# Patient Record
Sex: Female | Born: 1960
Health system: Southern US, Community
[De-identification: ages and names within clinical notes are randomized; demographics above are authoritative.]

## PROBLEM LIST (undated history)

## (undated) DIAGNOSIS — K589 Irritable bowel syndrome without diarrhea: Secondary | ICD-10-CM

## (undated) DIAGNOSIS — K602 Anal fissure, unspecified: Secondary | ICD-10-CM

## (undated) DIAGNOSIS — K5792 Diverticulitis of intestine, part unspecified, without perforation or abscess without bleeding: Secondary | ICD-10-CM

## (undated) DIAGNOSIS — K219 Gastro-esophageal reflux disease without esophagitis: Secondary | ICD-10-CM

## (undated) DIAGNOSIS — R0602 Shortness of breath: Secondary | ICD-10-CM

## (undated) DIAGNOSIS — R519 Headache, unspecified: Secondary | ICD-10-CM

## (undated) DIAGNOSIS — R42 Dizziness and giddiness: Secondary | ICD-10-CM

## (undated) DIAGNOSIS — K5790 Diverticulosis of intestine, part unspecified, without perforation or abscess without bleeding: Secondary | ICD-10-CM

## (undated) DIAGNOSIS — E785 Hyperlipidemia, unspecified: Secondary | ICD-10-CM

## (undated) DIAGNOSIS — R51 Headache: Secondary | ICD-10-CM

## (undated) DIAGNOSIS — I10 Essential (primary) hypertension: Secondary | ICD-10-CM

## (undated) DIAGNOSIS — F419 Anxiety disorder, unspecified: Secondary | ICD-10-CM

## (undated) HISTORY — DX: Hyperlipidemia, unspecified: E78.5

## (undated) HISTORY — DX: Anal fissure, unspecified: K60.2

## (undated) HISTORY — PX: BREAST SURGERY: SHX581

## (undated) HISTORY — DX: Gastro-esophageal reflux disease without esophagitis: K21.9

## (undated) HISTORY — DX: Headache, unspecified: R51.9

## (undated) HISTORY — PX: COLONOSCOPY: SHX174

## (undated) HISTORY — PX: ABDOMINAL HYSTERECTOMY: SHX81

## (undated) HISTORY — DX: Diverticulitis of intestine, part unspecified, without perforation or abscess without bleeding: K57.92

## (undated) HISTORY — DX: Anxiety disorder, unspecified: F41.9

## (undated) HISTORY — PX: LAPAROSCOPY: SHX197

## (undated) HISTORY — DX: Irritable bowel syndrome, unspecified: K58.9

## (undated) HISTORY — DX: Essential (primary) hypertension: I10

## (undated) HISTORY — DX: Dizziness and giddiness: R42

## (undated) HISTORY — PX: NECK SURGERY: SHX720

## (undated) HISTORY — PX: RECTAL SURGERY: SHX760

## (undated) HISTORY — DX: Diverticulosis of intestine, part unspecified, without perforation or abscess without bleeding: K57.90

## (undated) HISTORY — PX: TONSILECTOMY, ADENOIDECTOMY, BILATERAL MYRINGOTOMY AND TUBES: SHX2538

## (undated) HISTORY — PX: APPENDECTOMY: SHX54

## (undated) HISTORY — PX: HIP SURGERY: SHX245

## (undated) HISTORY — DX: Headache: R51

## (undated) HISTORY — DX: Shortness of breath: R06.02

---

## 1999-12-26 ENCOUNTER — Encounter: Payer: Self-pay | Admitting: Gastroenterology

## 1999-12-26 ENCOUNTER — Encounter: Payer: Self-pay | Admitting: Internal Medicine

## 2000-03-14 ENCOUNTER — Other Ambulatory Visit: Admission: RE | Admit: 2000-03-14 | Discharge: 2000-03-14 | Payer: Self-pay | Admitting: Internal Medicine

## 2000-04-18 ENCOUNTER — Encounter: Payer: Self-pay | Admitting: Internal Medicine

## 2000-04-18 ENCOUNTER — Ambulatory Visit (HOSPITAL_COMMUNITY): Admission: RE | Admit: 2000-04-18 | Discharge: 2000-04-18 | Payer: Self-pay | Admitting: Internal Medicine

## 2001-10-26 ENCOUNTER — Encounter (INDEPENDENT_AMBULATORY_CARE_PROVIDER_SITE_OTHER): Payer: Self-pay

## 2001-10-26 ENCOUNTER — Inpatient Hospital Stay (HOSPITAL_COMMUNITY): Admission: EM | Admit: 2001-10-26 | Discharge: 2001-10-28 | Payer: Self-pay | Admitting: Emergency Medicine

## 2001-10-26 ENCOUNTER — Encounter: Payer: Self-pay | Admitting: General Surgery

## 2002-06-25 ENCOUNTER — Other Ambulatory Visit: Admission: RE | Admit: 2002-06-25 | Discharge: 2002-06-25 | Payer: Self-pay | Admitting: Internal Medicine

## 2002-07-16 ENCOUNTER — Emergency Department (HOSPITAL_COMMUNITY): Admission: EM | Admit: 2002-07-16 | Discharge: 2002-07-16 | Payer: Self-pay | Admitting: Emergency Medicine

## 2002-07-16 ENCOUNTER — Encounter: Payer: Self-pay | Admitting: Emergency Medicine

## 2003-10-03 ENCOUNTER — Encounter: Admission: RE | Admit: 2003-10-03 | Discharge: 2003-10-03 | Payer: Self-pay | Admitting: Internal Medicine

## 2004-02-05 HISTORY — PX: BREAST EXCISIONAL BIOPSY: SUR124

## 2004-02-10 ENCOUNTER — Encounter: Admission: RE | Admit: 2004-02-10 | Discharge: 2004-02-10 | Payer: Self-pay | Admitting: Internal Medicine

## 2004-02-24 ENCOUNTER — Encounter: Admission: RE | Admit: 2004-02-24 | Discharge: 2004-02-24 | Payer: Self-pay | Admitting: Internal Medicine

## 2004-05-17 ENCOUNTER — Ambulatory Visit: Payer: Self-pay | Admitting: Internal Medicine

## 2004-06-08 ENCOUNTER — Other Ambulatory Visit: Admission: RE | Admit: 2004-06-08 | Discharge: 2004-06-08 | Payer: Self-pay | Admitting: General Surgery

## 2004-06-26 ENCOUNTER — Ambulatory Visit (HOSPITAL_BASED_OUTPATIENT_CLINIC_OR_DEPARTMENT_OTHER): Admission: RE | Admit: 2004-06-26 | Discharge: 2004-06-26 | Payer: Self-pay | Admitting: General Surgery

## 2004-06-26 ENCOUNTER — Ambulatory Visit (HOSPITAL_COMMUNITY): Admission: RE | Admit: 2004-06-26 | Discharge: 2004-06-26 | Payer: Self-pay | Admitting: General Surgery

## 2004-06-26 ENCOUNTER — Encounter (INDEPENDENT_AMBULATORY_CARE_PROVIDER_SITE_OTHER): Payer: Self-pay | Admitting: Specialist

## 2004-07-04 ENCOUNTER — Ambulatory Visit: Payer: Self-pay | Admitting: Internal Medicine

## 2004-08-09 ENCOUNTER — Ambulatory Visit: Payer: Self-pay | Admitting: Family Medicine

## 2004-08-10 ENCOUNTER — Encounter: Admission: RE | Admit: 2004-08-10 | Discharge: 2004-08-10 | Payer: Self-pay | Admitting: Family Medicine

## 2004-08-13 ENCOUNTER — Encounter: Admission: RE | Admit: 2004-08-13 | Discharge: 2004-08-13 | Payer: Self-pay | Admitting: General Surgery

## 2004-10-09 ENCOUNTER — Ambulatory Visit: Payer: Self-pay | Admitting: Gastroenterology

## 2004-10-16 ENCOUNTER — Ambulatory Visit: Payer: Self-pay | Admitting: Gastroenterology

## 2004-10-16 LAB — HM COLONOSCOPY

## 2004-10-29 ENCOUNTER — Encounter: Admission: RE | Admit: 2004-10-29 | Discharge: 2004-10-29 | Payer: Self-pay | Admitting: Internal Medicine

## 2004-10-29 ENCOUNTER — Ambulatory Visit: Payer: Self-pay | Admitting: Internal Medicine

## 2005-01-02 ENCOUNTER — Ambulatory Visit: Payer: Self-pay | Admitting: Internal Medicine

## 2005-01-10 ENCOUNTER — Other Ambulatory Visit: Admission: RE | Admit: 2005-01-10 | Discharge: 2005-01-10 | Payer: Self-pay | Admitting: Internal Medicine

## 2005-01-10 ENCOUNTER — Ambulatory Visit: Payer: Self-pay | Admitting: Internal Medicine

## 2005-01-10 ENCOUNTER — Encounter: Payer: Self-pay | Admitting: Internal Medicine

## 2005-02-15 ENCOUNTER — Encounter: Admission: RE | Admit: 2005-02-15 | Discharge: 2005-02-15 | Payer: Self-pay | Admitting: Internal Medicine

## 2005-04-26 ENCOUNTER — Ambulatory Visit: Payer: Self-pay | Admitting: Internal Medicine

## 2005-04-29 ENCOUNTER — Ambulatory Visit: Payer: Self-pay | Admitting: Internal Medicine

## 2005-07-02 ENCOUNTER — Ambulatory Visit: Payer: Self-pay | Admitting: Internal Medicine

## 2005-09-13 ENCOUNTER — Ambulatory Visit: Payer: Self-pay | Admitting: Internal Medicine

## 2005-10-15 ENCOUNTER — Ambulatory Visit: Payer: Self-pay | Admitting: Internal Medicine

## 2005-10-30 ENCOUNTER — Emergency Department (HOSPITAL_COMMUNITY): Admission: EM | Admit: 2005-10-30 | Discharge: 2005-10-30 | Payer: Self-pay | Admitting: Emergency Medicine

## 2005-12-13 ENCOUNTER — Encounter (INDEPENDENT_AMBULATORY_CARE_PROVIDER_SITE_OTHER): Payer: Self-pay | Admitting: Specialist

## 2005-12-13 ENCOUNTER — Ambulatory Visit (HOSPITAL_COMMUNITY): Admission: RE | Admit: 2005-12-13 | Discharge: 2005-12-13 | Payer: Self-pay | Admitting: Obstetrics and Gynecology

## 2006-01-24 ENCOUNTER — Ambulatory Visit: Payer: Self-pay | Admitting: Internal Medicine

## 2006-02-04 DIAGNOSIS — E663 Overweight: Secondary | ICD-10-CM | POA: Insufficient documentation

## 2006-02-20 ENCOUNTER — Encounter: Admission: RE | Admit: 2006-02-20 | Discharge: 2006-02-20 | Payer: Self-pay | Admitting: Internal Medicine

## 2006-04-23 ENCOUNTER — Encounter: Payer: Self-pay | Admitting: Family Medicine

## 2006-04-23 ENCOUNTER — Ambulatory Visit: Payer: Self-pay | Admitting: Internal Medicine

## 2006-07-25 ENCOUNTER — Ambulatory Visit: Payer: Self-pay | Admitting: Internal Medicine

## 2006-07-25 LAB — CONVERTED CEMR LAB
ALT: 20 units/L (ref 0–40)
AST: 19 units/L (ref 0–37)
Albumin: 3.7 g/dL (ref 3.5–5.2)
Alkaline Phosphatase: 95 units/L (ref 39–117)
BUN: 7 mg/dL (ref 6–23)
Basophils Absolute: 0.1 10*3/uL (ref 0.0–0.1)
Basophils Relative: 0.7 % (ref 0.0–1.0)
Bilirubin, Direct: 0.1 mg/dL (ref 0.0–0.3)
CO2: 28 meq/L (ref 19–32)
Calcium: 9.1 mg/dL (ref 8.4–10.5)
Chloride: 107 meq/L (ref 96–112)
Creatinine, Ser: 0.8 mg/dL (ref 0.4–1.2)
Direct LDL: 127.1 mg/dL
Eosinophils Absolute: 0.1 10*3/uL (ref 0.0–0.6)
Eosinophils Relative: 0.7 % (ref 0.0–5.0)
GFR calc Af Amer: 99 mL/min
GFR calc non Af Amer: 82 mL/min
Glucose, Bld: 95 mg/dL (ref 70–99)
HCT: 38 % (ref 36.0–46.0)
Hemoglobin: 12.9 g/dL (ref 12.0–15.0)
Lymphocytes Relative: 22 % (ref 12.0–46.0)
MCHC: 34 g/dL (ref 30.0–36.0)
MCV: 89.9 fL (ref 78.0–100.0)
Monocytes Absolute: 0.7 10*3/uL (ref 0.2–0.7)
Monocytes Relative: 6.4 % (ref 3.0–11.0)
Neutro Abs: 7.1 10*3/uL (ref 1.4–7.7)
Neutrophils Relative %: 70.2 % (ref 43.0–77.0)
Platelets: 203 10*3/uL (ref 150–400)
Potassium: 3.3 meq/L — ABNORMAL LOW (ref 3.5–5.1)
RBC: 4.23 M/uL (ref 3.87–5.11)
RDW: 13.1 % (ref 11.5–14.6)
Sodium: 141 meq/L (ref 135–145)
TSH: 1.28 microintl units/mL (ref 0.35–5.50)
Total Bilirubin: 0.5 mg/dL (ref 0.3–1.2)
Total Protein: 7.4 g/dL (ref 6.0–8.3)
WBC: 10.2 10*3/uL (ref 4.5–10.5)

## 2006-08-01 ENCOUNTER — Ambulatory Visit: Payer: Self-pay | Admitting: Internal Medicine

## 2006-10-14 ENCOUNTER — Ambulatory Visit: Payer: Self-pay | Admitting: Internal Medicine

## 2006-10-14 ENCOUNTER — Ambulatory Visit: Payer: Self-pay | Admitting: Cardiology

## 2006-10-14 DIAGNOSIS — R51 Headache: Secondary | ICD-10-CM | POA: Insufficient documentation

## 2006-10-14 DIAGNOSIS — R519 Headache, unspecified: Secondary | ICD-10-CM | POA: Insufficient documentation

## 2006-10-14 DIAGNOSIS — I10 Essential (primary) hypertension: Secondary | ICD-10-CM | POA: Insufficient documentation

## 2006-10-14 LAB — CONVERTED CEMR LAB
BUN: 7 mg/dL (ref 6–23)
Basophils Absolute: 0 10*3/uL (ref 0.0–0.1)
Basophils Relative: 0.3 % (ref 0.0–1.0)
CO2: 24 meq/L (ref 19–32)
Calcium: 9.8 mg/dL (ref 8.4–10.5)
Chloride: 106 meq/L (ref 96–112)
Creatinine, Ser: 0.8 mg/dL (ref 0.4–1.2)
Eosinophils Absolute: 0 10*3/uL (ref 0.0–0.6)
Eosinophils Relative: 0.3 % (ref 0.0–5.0)
GFR calc Af Amer: 99 mL/min
GFR calc non Af Amer: 82 mL/min
Glucose, Bld: 80 mg/dL (ref 70–99)
HCT: 39.6 % (ref 36.0–46.0)
Hemoglobin: 13.7 g/dL (ref 12.0–15.0)
Lymphocytes Relative: 16.6 % (ref 12.0–46.0)
MCHC: 34.6 g/dL (ref 30.0–36.0)
MCV: 90.6 fL (ref 78.0–100.0)
Monocytes Absolute: 0.4 10*3/uL (ref 0.2–0.7)
Monocytes Relative: 3.4 % (ref 3.0–11.0)
Neutro Abs: 8.4 10*3/uL — ABNORMAL HIGH (ref 1.4–7.7)
Neutrophils Relative %: 79.4 % — ABNORMAL HIGH (ref 43.0–77.0)
Platelets: 182 10*3/uL (ref 150–400)
Potassium: 3.8 meq/L (ref 3.5–5.1)
RBC: 4.37 M/uL (ref 3.87–5.11)
RDW: 12.9 % (ref 11.5–14.6)
Sodium: 140 meq/L (ref 135–145)
WBC: 10.8 10*3/uL — ABNORMAL HIGH (ref 4.5–10.5)

## 2006-11-25 ENCOUNTER — Encounter: Payer: Self-pay | Admitting: Internal Medicine

## 2006-12-08 ENCOUNTER — Encounter: Payer: Self-pay | Admitting: Internal Medicine

## 2007-01-06 ENCOUNTER — Ambulatory Visit: Payer: Self-pay | Admitting: Internal Medicine

## 2007-01-20 ENCOUNTER — Encounter: Admission: RE | Admit: 2007-01-20 | Discharge: 2007-01-20 | Payer: Self-pay | Admitting: Internal Medicine

## 2007-01-20 IMAGING — MG MM DIAGNOSTIC BILATERAL
5 series · 5 of 5 positions shown · non-contrast
Comparison: none

DG DIAGNOSTIC BILATERAL
Bilateral CC and MLO view(s) were taken.

RIGHT BREAST ULTRASOUND
DIGITAL BILATERAL DIAGNOSTIC MAMMOGRAM WITH CAD AND RIGHT BREAST ULTRASOUND:
CLINICAL DATA: Patient feels a lump in the right upper inner quadrant.

[R CC]
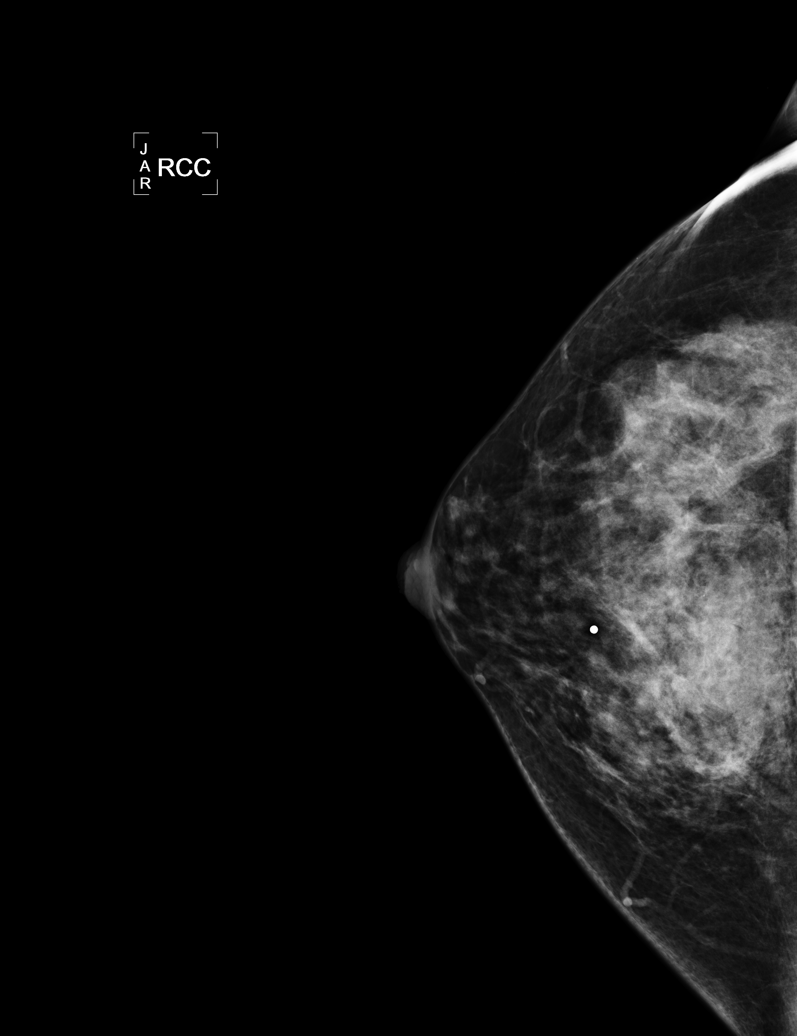

[L CC]
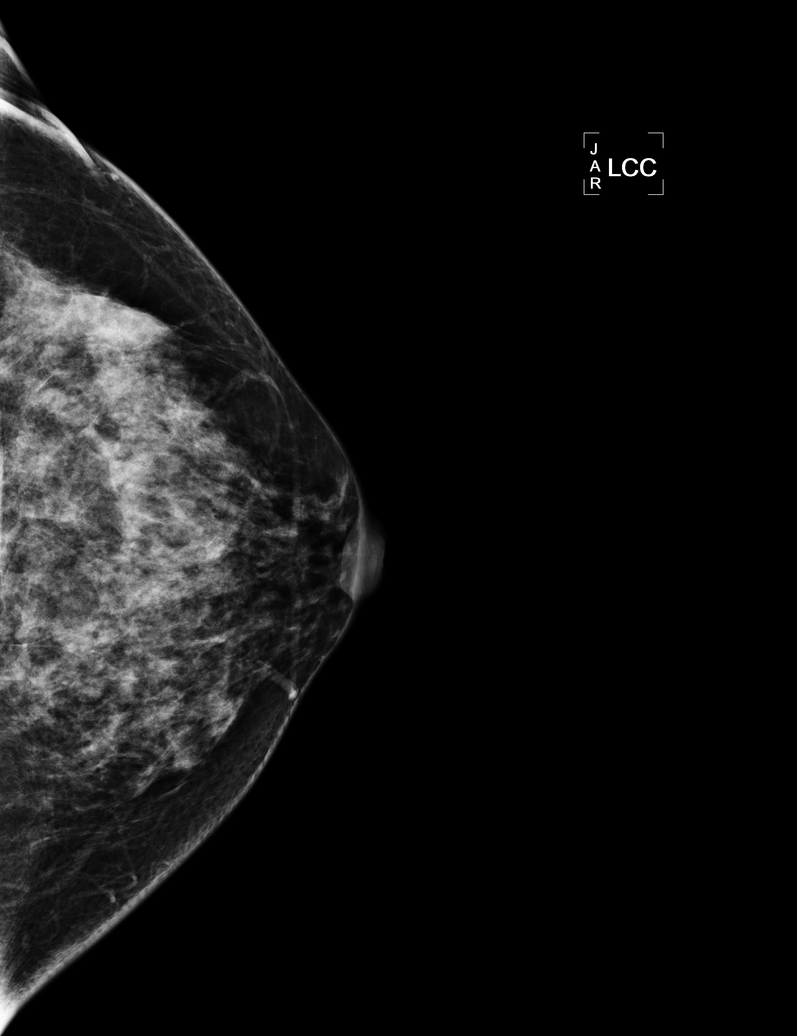

[L MLO]
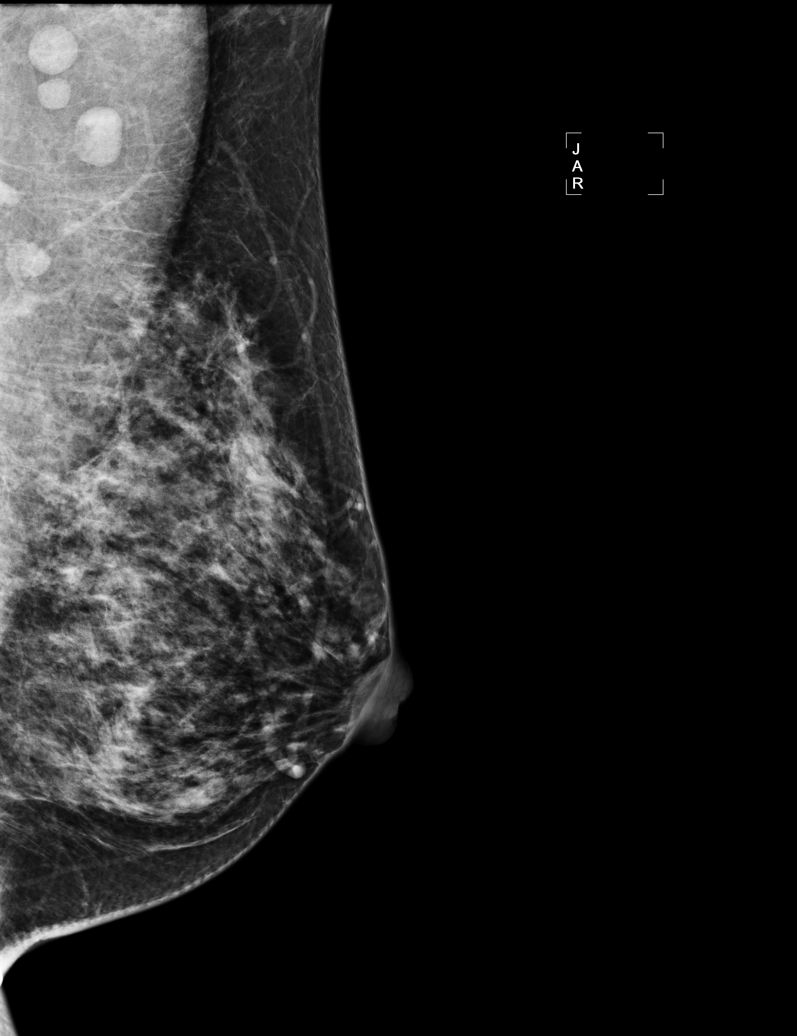

[R MLO]
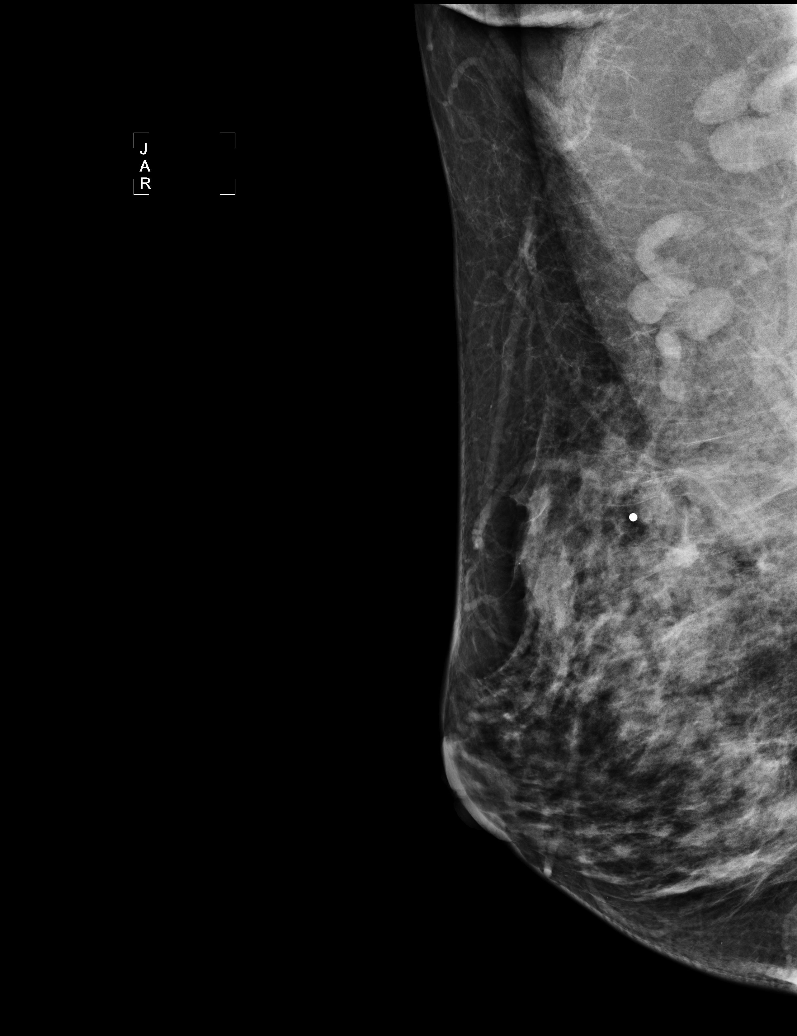

[R TAN]
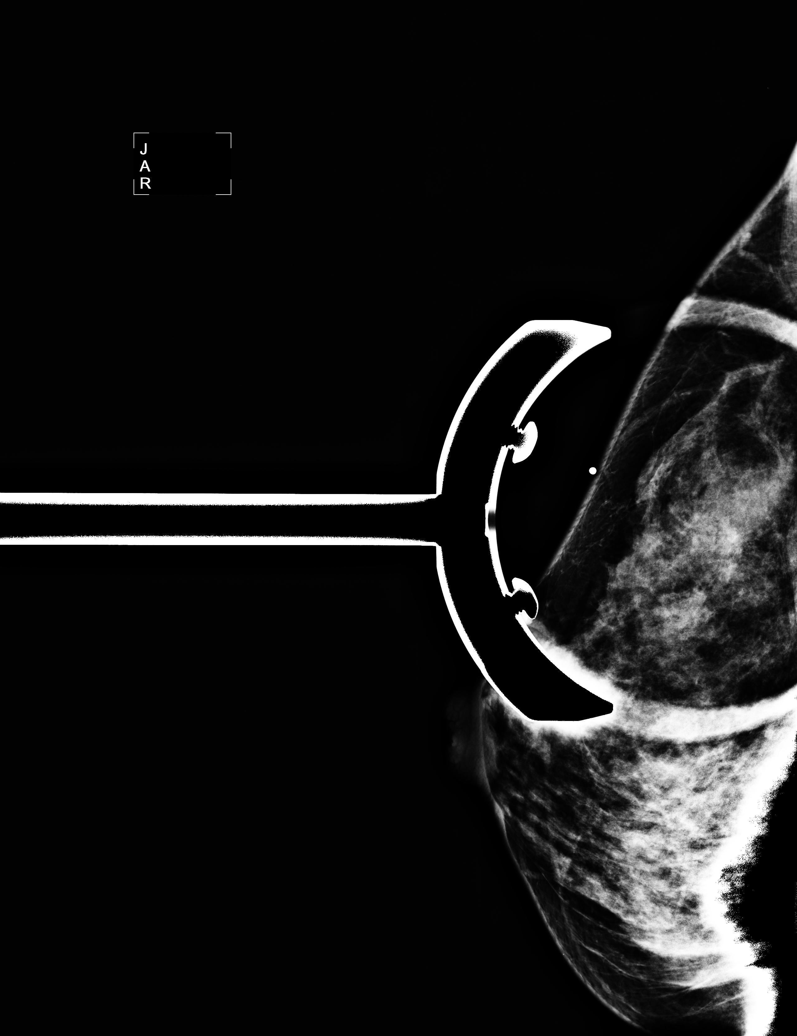

[5 of 5 positions shown; findings below may reference images not displayed]

Comparison is made to prior studies dated [DATE], [DATE] and [DATE].  The breast tissue is 
heterogeneously dense. There is an obscured mass in the right upper inner quadrant posteriorly 
which corresponds to the site of the palpable finding.  This is best seen on the CC view.  No other
abnormality is noted.

On physical examination, I palpate a 2 cm mobile mass at 1 o'clock 5 cm from the right nipple. 
Sonography demonstrates a simple cyst measuring 1.7 x 1.4 x 1.1 cm.  There are several adjacent 
smaller cysts.
IMPRESSION: No mammographic or sonographic evidence of malignancy.  The palpable finding in the right upper 
inner quadrant corresponds to a simple cyst  Yearly screening mammography is suggested.

ASSESSMENT: Benign - BI-RADS 2

Screening mammogram of both breasts in 1 year.
ANALYZED BY COMPUTER AIDED DETECTION. ,

## 2007-01-26 ENCOUNTER — Telehealth: Payer: Self-pay | Admitting: Internal Medicine

## 2007-03-02 ENCOUNTER — Encounter: Payer: Self-pay | Admitting: Internal Medicine

## 2007-04-13 ENCOUNTER — Ambulatory Visit: Payer: Self-pay | Admitting: Internal Medicine

## 2007-04-29 ENCOUNTER — Inpatient Hospital Stay (HOSPITAL_COMMUNITY): Admission: RE | Admit: 2007-04-29 | Discharge: 2007-05-01 | Payer: Self-pay | Admitting: Obstetrics and Gynecology

## 2007-04-29 ENCOUNTER — Encounter (INDEPENDENT_AMBULATORY_CARE_PROVIDER_SITE_OTHER): Payer: Self-pay | Admitting: Obstetrics and Gynecology

## 2007-09-02 ENCOUNTER — Ambulatory Visit: Payer: Self-pay | Admitting: Internal Medicine

## 2007-10-29 ENCOUNTER — Ambulatory Visit: Payer: Self-pay | Admitting: Internal Medicine

## 2007-10-30 LAB — CONVERTED CEMR LAB
Creatinine, Ser: 0.8 mg/dL (ref 0.4–1.2)
Glucose, Bld: 92 mg/dL (ref 70–99)
Potassium: 3.2 meq/L — ABNORMAL LOW (ref 3.5–5.1)
Sodium: 139 meq/L (ref 135–145)

## 2007-11-12 ENCOUNTER — Ambulatory Visit: Payer: Self-pay | Admitting: Internal Medicine

## 2007-11-24 LAB — CONVERTED CEMR LAB
CO2: 26 meq/L (ref 19–32)
Chloride: 105 meq/L (ref 96–112)
GFR calc Af Amer: 99 mL/min
Glucose, Bld: 82 mg/dL (ref 70–99)
Potassium: 3.6 meq/L (ref 3.5–5.1)
Sodium: 140 meq/L (ref 135–145)

## 2007-12-14 ENCOUNTER — Encounter: Payer: Self-pay | Admitting: Internal Medicine

## 2007-12-14 ENCOUNTER — Emergency Department (HOSPITAL_BASED_OUTPATIENT_CLINIC_OR_DEPARTMENT_OTHER): Admission: EM | Admit: 2007-12-14 | Discharge: 2007-12-14 | Payer: Self-pay | Admitting: Emergency Medicine

## 2007-12-15 ENCOUNTER — Ambulatory Visit: Payer: Self-pay | Admitting: Internal Medicine

## 2007-12-21 LAB — CONVERTED CEMR LAB: TSH: 0.71 microintl units/mL (ref 0.35–5.50)

## 2007-12-30 ENCOUNTER — Ambulatory Visit: Payer: Self-pay

## 2007-12-30 ENCOUNTER — Encounter: Payer: Self-pay | Admitting: Internal Medicine

## 2008-01-01 ENCOUNTER — Encounter: Payer: Self-pay | Admitting: Internal Medicine

## 2008-02-09 ENCOUNTER — Encounter: Admission: RE | Admit: 2008-02-09 | Discharge: 2008-02-09 | Payer: Self-pay | Admitting: Internal Medicine

## 2008-03-09 ENCOUNTER — Ambulatory Visit: Payer: Self-pay | Admitting: Gastroenterology

## 2008-03-28 ENCOUNTER — Ambulatory Visit: Payer: Self-pay | Admitting: Internal Medicine

## 2008-08-31 ENCOUNTER — Emergency Department (HOSPITAL_COMMUNITY): Admission: EM | Admit: 2008-08-31 | Discharge: 2008-08-31 | Payer: Self-pay | Admitting: Dentistry

## 2008-08-31 ENCOUNTER — Ambulatory Visit: Payer: Self-pay | Admitting: Family Medicine

## 2008-09-02 ENCOUNTER — Ambulatory Visit: Payer: Self-pay | Admitting: Cardiology

## 2008-09-14 ENCOUNTER — Ambulatory Visit: Payer: Self-pay | Admitting: Internal Medicine

## 2008-09-16 LAB — CONVERTED CEMR LAB
Chloride: 105 meq/L (ref 96–112)
Potassium: 3.1 meq/L — ABNORMAL LOW (ref 3.5–5.1)
Sodium: 140 meq/L (ref 135–145)
TSH: 0.92 microintl units/mL (ref 0.35–5.50)

## 2008-11-02 ENCOUNTER — Telehealth: Payer: Self-pay | Admitting: Internal Medicine

## 2008-11-16 ENCOUNTER — Ambulatory Visit: Payer: Self-pay | Admitting: Internal Medicine

## 2009-01-16 ENCOUNTER — Telehealth: Payer: Self-pay | Admitting: Internal Medicine

## 2009-02-28 ENCOUNTER — Encounter: Admission: RE | Admit: 2009-02-28 | Discharge: 2009-02-28 | Payer: Self-pay | Admitting: Internal Medicine

## 2009-02-28 LAB — HM MAMMOGRAPHY

## 2009-03-09 ENCOUNTER — Telehealth: Payer: Self-pay | Admitting: Internal Medicine

## 2009-05-16 ENCOUNTER — Telehealth: Payer: Self-pay | Admitting: Internal Medicine

## 2009-06-02 ENCOUNTER — Encounter: Payer: Self-pay | Admitting: Internal Medicine

## 2009-06-28 ENCOUNTER — Ambulatory Visit: Payer: Self-pay | Admitting: Internal Medicine

## 2009-06-28 DIAGNOSIS — R7309 Other abnormal glucose: Secondary | ICD-10-CM

## 2009-07-21 ENCOUNTER — Encounter: Admission: RE | Admit: 2009-07-21 | Discharge: 2009-07-21 | Payer: Self-pay | Admitting: Family Medicine

## 2009-08-25 ENCOUNTER — Encounter (INDEPENDENT_AMBULATORY_CARE_PROVIDER_SITE_OTHER): Payer: Self-pay | Admitting: *Deleted

## 2009-09-27 ENCOUNTER — Ambulatory Visit: Payer: Self-pay | Admitting: Internal Medicine

## 2009-09-27 LAB — CONVERTED CEMR LAB
ALT: 16 units/L (ref 0–35)
AST: 18 units/L (ref 0–37)
Alkaline Phosphatase: 101 units/L (ref 39–117)
BUN: 10 mg/dL (ref 6–23)
Chloride: 105 meq/L (ref 96–112)
Cholesterol: 209 mg/dL — ABNORMAL HIGH (ref 0–200)
GFR calc non Af Amer: 112.36 mL/min (ref >60)
Glucose, Bld: 85 mg/dL (ref 70–99)
HDL: 65.2 mg/dL (ref >39.00)
Hgb A1c MFr Bld: 6.2 % (ref 4.6–6.5)
Potassium: 3.6 meq/L (ref 3.5–5.1)
Sodium: 140 meq/L (ref 135–145)
Total Bilirubin: 0.4 mg/dL (ref 0.3–1.2)
Total CHOL/HDL Ratio: 3
Triglycerides: 93 mg/dL (ref 0.0–149.0)
VLDL: 18.6 mg/dL (ref 0.0–40.0)

## 2009-10-04 ENCOUNTER — Ambulatory Visit: Payer: Self-pay | Admitting: Internal Medicine

## 2009-10-04 DIAGNOSIS — E785 Hyperlipidemia, unspecified: Secondary | ICD-10-CM | POA: Insufficient documentation

## 2009-10-17 ENCOUNTER — Encounter (INDEPENDENT_AMBULATORY_CARE_PROVIDER_SITE_OTHER): Payer: Self-pay | Admitting: *Deleted

## 2009-10-19 ENCOUNTER — Ambulatory Visit: Payer: Self-pay | Admitting: Gastroenterology

## 2009-11-02 ENCOUNTER — Ambulatory Visit: Payer: Self-pay | Admitting: Gastroenterology

## 2010-01-12 ENCOUNTER — Ambulatory Visit: Payer: Self-pay | Admitting: Internal Medicine

## 2010-01-12 DIAGNOSIS — M25559 Pain in unspecified hip: Secondary | ICD-10-CM

## 2010-01-12 DIAGNOSIS — J069 Acute upper respiratory infection, unspecified: Secondary | ICD-10-CM | POA: Insufficient documentation

## 2010-02-24 ENCOUNTER — Other Ambulatory Visit: Payer: Self-pay | Admitting: Obstetrics and Gynecology

## 2010-02-24 DIAGNOSIS — Z1239 Encounter for other screening for malignant neoplasm of breast: Secondary | ICD-10-CM

## 2010-02-25 ENCOUNTER — Encounter: Payer: Self-pay | Admitting: Internal Medicine

## 2010-03-06 NOTE — Assessment & Plan Note (Signed)
Summary: 3 month rov/njr   Vital Signs:  Patient profile:   50 year old female Menstrual status:  hysterectomy Weight:      187 pounds BMI:     30.29 Temp:     98.6 degrees F oral Pulse rate:   60 / minute Pulse rhythm:   regular Resp:     12 per minute BP sitting:   134 / 92  (left arm) Cuff size:   regular  Vitals Entered By: Gladis Riffle, RN (October 04, 2009 11:59 AM) CC: 3 month rov, labs done--stopped potassium as thought was done Is Patient Diabetic? No   Primary Care Henretta Quist:  Birdie Sons, MD  CC:  3 month rov and labs done--stopped potassium as thought was done.  History of Present Illness:  Follow-Up Visit      This is a 50 year old woman who presents for Follow-up visit.  The patient denies chest pain and palpitations.  Since the last visit the patient notes no new problems or concerns.  The patient reports taking meds as prescribed, dietary noncompliance, and not exercising.  When questioned about possible medication side effects, the patient notes none.    All other systems reviewed and were negative   Preventive Screening-Counseling & Management  Alcohol-Tobacco     Smoking Status: quit < 6 months     Year Quit: 1980  Comments: never heavy  smoker  Current Medications (verified): 1)  Metoprolol Tartrate 50 Mg Tabs (Metoprolol Tartrate) .Marland Kitchen.. 1 By Mouth Two Times A Day 2)  Lorazepam 0.5 Mg Tabs (Lorazepam) .... 1/2-1 By Mouth Once Daily As Needed Anxiety  Allergies: 1)  ! Amlodipine Besylate (Amlodipine Besylate) 2)  Lisinopril-Hydrochlorothiazide (Lisinopril-Hydrochlorothiazide) 3)  Codeine Sulfate (Codeine Sulfate) 4)  Demerol (Meperidine Hcl)  Past History:  Past Surgical History: Last updated: 03/08/2008 rectal surgery--thrombosis/fissure cyst removal from neck laproscopy Appendectomy Breast lumpectomy Tonsillectomy  Family History: Last updated: 03/09/2008 mother--alive and well father deceased colon CA at age 75 sister alive and  well Family History Diabetes 1st degree relative--DM -Mother  Social History: Last updated: 03/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  Daily Caffeine Use -1 Patient gets regular exercise.  Risk Factors: Exercise: yes (03/09/2008)  Risk Factors: Smoking Status: quit < 6 months (10/04/2009)  Past Medical History: Hypertension GERD Hemorrhoids Diverticulosis Anal Fissure IBS Hyperlipidemia  Social History: Smoking Status:  quit < 6 months  Physical Exam  General:  alert and well-developed.   Head:  normocephalic and atraumatic.   Eyes:  pupils equal and pupils round.   Ears:  R ear normal and L ear normal.   Neck:  No deformities, masses, or tenderness noted. Chest Wall:  No deformities, masses, or tenderness noted. Heart:  normal rate and regular rhythm.   Skin:  turgor normal and color normal.     Impression & Recommendations:  Problem # 1:  HYPERGLYCEMIA (ICD-790.29)  reviewed labs will continue to monitor  Labs Reviewed: Creat: 0.7 (09/27/2009)     Problem # 2:  HYPERLIPIDEMIA (ICD-272.4) note HDL no Rx indicated Labs Reviewed: SGOT: 18 (09/27/2009)   SGPT: 16 (09/27/2009)   HDL:65.20 (09/27/2009)  Chol:209 (09/27/2009)  Trig:93.0 (09/27/2009)  Problem # 3:  HYPERTENSION (ICD-401.9) reasonable control needs to lose weight Her updated medication list for this problem includes:    Metoprolol Tartrate 50 Mg Tabs (Metoprolol tartrate) .Marland Kitchen... 1 by mouth two times a day  BP today: 134/92 Prior BP: 120/86 (11/16/2008)  Labs Reviewed: K+: 3.6 (09/27/2009)  Creat: : 0.7 (09/27/2009)   Chol: 209 (09/27/2009)   HDL: 65.20 (09/27/2009)   TG: 93.0 (09/27/2009)  Complete Medication List: 1)  Metoprolol Tartrate 50 Mg Tabs (Metoprolol tartrate) .Marland Kitchen.. 1 by mouth two times a day 2)  Lorazepam 0.5 Mg Tabs (Lorazepam) .... 1/2-1 by mouth once daily as needed anxiety  Patient Instructions: 1)  Please schedule a  follow-up appointment in 6 months. 2)  labs one week prior to visit 3)  lipids---272.4 4)  lfts-995.2 5)  bmet-995.2 6)  A1C-250.02 7)     Prescriptions: LORAZEPAM 0.5 MG TABS (LORAZEPAM) 1/2-1 by mouth once daily as needed anxiety  #10 x 1   Entered and Authorized by:   Birdie Sons MD   Signed by:   Birdie Sons MD on 10/04/2009   Method used:   Print then Give to Patient   RxID:   [id number]

## 2010-03-06 NOTE — Progress Notes (Signed)
Summary: Pt req refill of Ativan .5mg  #10`  Phone Note Call from Patient Call back at [phone]   Caller: Patient Summary of Call: Pt req script for Ativan .5mg  #10.  call in to [facility] [address] Initial call taken by: Lucy Antigua,  March 09, 2009 1:25 PM    Prescriptions: LORAZEPAM 0.5 MG TABS (LORAZEPAM) 1/2-1 by mouth once daily as needed anxiety  #10 x 0   Entered by:   Gladis Riffle, RN   Authorized by:   Birdie Sons MD   Signed by:   Gladis Riffle, RN on 03/09/2009   Method used:   Telephoned to ...       [facility] [address] [id number]* (retail)       [address] [place], [place]  [address]       Ph: [phone]       Fax: [phone]   RxID:   [id number]

## 2010-03-06 NOTE — Assessment & Plan Note (Signed)
Summary: fu from gyn/njr   Primary Care Provider:  Birdie Sons, MD   History of Present Illness: Pt brings in labs from gyn demonstrating slightly elevated glucose and borderline A1C---see scanned results she is overweight, follows a poor diet and does not exercise no sxs of DM she does have a fhx of DM  All other systems reviewed and were negative   Allergies: 1)  ! Amlodipine Besylate (Amlodipine Besylate) 2)  Lisinopril-Hydrochlorothiazide (Lisinopril-Hydrochlorothiazide) 3)  Codeine Sulfate (Codeine Sulfate) 4)  Demerol (Meperidine Hcl)  Past History:  Past Medical History: Last updated: 03/09/2008 Hypertension GERD Hemorrhoids Diverticulosis Anal Fissure IBS  Past Surgical History: Last updated: 03/08/2008 rectal surgery--thrombosis/fissure cyst removal from neck laproscopy Appendectomy Breast lumpectomy Tonsillectomy  Family History: Last updated: 03/09/2008 mother--alive and well father deceased colon CA at age 46 sister alive and well Family History Diabetes 1st degree relative--DM -Mother  Social History: Last updated: 03/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  Daily Caffeine Use -1 Patient gets regular exercise.  Risk Factors: Exercise: yes (03/09/2008)  Risk Factors: Smoking Status: current (11/16/2008)  Review of Systems  The patient denies anorexia, fever, weight gain, vision loss, and chest pain.         All other systems reviewed and were negative   Physical Exam  General:  Well-developed,well-nourished,in no acute distress; alert,appropriate and cooperative throughout examination Head:  Normocephalic and atraumatic without obvious abnormalities. No apparent alopecia or balding. Lungs:  Normal respiratory effort, chest expands symmetrically. Lungs are clear to auscultation, no crackles or wheezes. Heart:  Normal rate and regular rhythm. S1 and S2 normal without gallop, murmur,  click, rub or other extra sounds.   Impression & Recommendations:  Problem # 1:  HYPERGLYCEMIA (ICD-790.29) minimally elevated lab values see results sxs of DM discussed will check labs in 3 months reassurance provided  Complete Medication List: 1)  Metoprolol Tartrate 50 Mg Tabs (Metoprolol tartrate) .Marland Kitchen.. 1 by mouth two times a day 2)  Anusol-hc 25 Mg Supp (Hydrocortisone acetate) .... One suppository into rectum at bedtime as needed 3)  Analpram-hc 1-2.5 % Crea (Hydrocortisone ace-pramoxine) .... Apply rectally two times a day as needed 4)  Lorazepam 0.5 Mg Tabs (Lorazepam) .... 1/2-1 by mouth once daily as needed anxiety 5)  Klor-con M20 20 Meq Cr-tabs (Potassium chloride crys cr) .... Take 1 tablet by mouth once a day  Patient Instructions: 1)  Please schedule a follow-up appointment in 3 months. 2)  labs one week prior to visit 3)  lipids---272.4 4)  lfts-995.2 5)  bmet-995.2 6)  A1C-250.02 7)

## 2010-03-06 NOTE — Progress Notes (Signed)
Summary: Pt req refill of Lor[facility]epam to [facility] on [address]  Phone Note Call from Patient Call back at [facility] Phone [phone]   Caller: Patient Summary of Call: Pt is req a refill of Lorazepam.  Please call this in to [facility] [address]  Pt refused to contact the pharmacy herself to have refill req sent.  Initial call taken by: Lucy Antigua,  May 16, 2009 10:40 AM  Follow-up for Phone Call        see Rx.  Patient notified via personal voice mail. Follow-up by: Gladis Riffle, RN,  May 16, 2009 3:08 PM    Prescriptions: LORAZEPAM 0.5 MG TABS (LORAZEPAM) 1/2-1 by mouth once daily as needed anxiety  #10 x 0   Entered by:   Gladis Riffle, RN   Authorized by:   Birdie Sons MD   Signed by:   Gladis Riffle, RN on 05/16/2009   Method used:   Telephoned to ...       [facility] [address] [id number]* (retail)       [address] [place], [place]  [address]       Ph: [phone]       Fax: [phone]   RxID:   [id number]

## 2010-03-06 NOTE — Miscellaneous (Signed)
Summary: [facility]  Clinical Lists Changes  Medications: Added new medication of MOVIPREP 100 GM  SOLR (PEG-KCL-NACL-NASULF-NA ASC-C) As per prep instructions. - Signed Rx of MOVIPREP 100 GM  SOLR (PEG-KCL-NACL-NASULF-NA ASC-C) As per prep instructions.;  #1 x 0;  Signed;  Entered by: Ezra Sites RN;  Authorized by: Meryl Dare MD [facility];  Method used: Electronically to [facility] #[id number]*, [address] [place], [place]  [address], Ph: [phone], Fax: [phone] Observations: Added new observation of ALLERGY REV: Done (10/19/2009 8:05)    Prescriptions: MOVIPREP 100 GM  SOLR (PEG-KCL-NACL-NASULF-NA ASC-C) As per prep instructions.  #1 x 0   Entered by:   Ezra Sites RN   Authorized by:   Meryl Dare MD [facility]   Signed by:   Ezra Sites RN on 10/19/2009   Method used:   Electronically to        [facility] [address] #[id number]* (retail)       [address] [place], [place]  [address]       Ph: [phone]       Fax: [phone]   RxID:   [id number]

## 2010-03-06 NOTE — Letter (Signed)
Summary: Previsit letter  [facility]  [address] [place], [place] [address]   Phone: [phone]  Fax: [phone]       08/25/2009 MRN: [id number]  [facility] [address] [place], [place]  [address]  Dear Ms. Watkin,  Welcome to the Gastroenterology Division at [facility].    You are scheduled to see a nurse for your pre-procedure visit on 10-19-09 at 8:00a.m. on the 3rd floor at [facility], [address]  We ask that you try to arrive at our office 15 minutes prior to your appointment time to allow for check-in.  Your nurse visit will consist of discussing your medical and surgical history, your immediate family medical history, and your medications.    Please bring a complete list of all your medications or, if you prefer, bring the medication bottles and we will list them.  We will need to be aware of both prescribed and over the counter drugs.  We will need to know exact dosage information as well.  If you are on blood thinners (Coumadin, Plavix, Aggrenox, Ticlid, etc.) please call our office today/prior to your appointment, as we need to consult with your physician about holding your medication.   Please be prepared to read and sign documents such as consent forms, a financial agreement, and acknowledgement forms.  If necessary, and with your consent, a friend or relative is welcome to sit-in on the nurse visit with you.  Please bring your insurance card so that we may make a copy of it.  If your insurance requires a referral to see a specialist, please bring your referral form from your primary care physician.  No co-pay is required for this nurse visit.     If you cannot keep your appointment, please call [phone] to cancel or reschedule prior to your appointment date.  This allows [place] the opportunity to schedule an appointment for another patient in need of care.    Thank you for choosing [facility] for your medical  needs.  We appreciate the opportunity to care for you.  Please visit [place] at our website  to learn more about our practice.                     Sincerely.                                                                                                                   The Gastroenterology Division

## 2010-03-06 NOTE — Procedures (Signed)
Summary: Colonoscopy  Patient: [name] Note: All result statuses are Final unless otherwise noted.  Tests: (1) Colonoscopy (COL)   COL Colonoscopy           DONE     [facility]     [address]     [place], [place]  [address]           COLONOSCOPY PROCEDURE REPORT     PATIENT:  Brenda, Melendez  MR#:  [id number]     BIRTHDATE:  1961-01-20, 49 yrs. old  GENDER:  female     ENDOSCOPIST:  Judie Petit T. Russella Dar, MD, [facility]           PROCEDURE DATE:  11/02/2009     PROCEDURE:  Colonoscopy 19[address]     ASA CLASS:  Class II     INDICATIONS:  1) Elevated Risk Screening  2) family history of     colon cancer: father at 10.     MEDICATIONS:   Fentanyl 100 mcg IV, Versed 10 mg IV     DESCRIPTION OF PROCEDURE:   After the risks benefits and     alternatives of the procedure were thoroughly explained, informed     consent was obtained.  Digital rectal exam was performed and     revealed no abnormalities.   The LB [id number] endoscope     was introduced through the anus and advanced to the cecum, which     was identified by both the appendix and ileocecal valve, without     limitations.  The quality of the prep was excellent, using     MoviPrep.  The instrument was then slowly withdrawn as the colon     was fully examined.     <<PROCEDUREIMAGES>>     FINDINGS:  Moderate diverticulosis was found in the sigmoid to     descending colon.  Scattered diverticula were found in the     transverse colon.  A normal appearing cecum, ileocecal valve, and     appendiceal orifice were identified. The ascending, hepatic     flexure, splenic flexure, and rectum appeared unremarkable.     Retroflexed views in the rectum revealed no abnormalities.  The time     to cecum =  4.75  minutes. The scope was then withdrawn (time =     8.25  min) from the patient and the procedure completed.           COMPLICATIONS:  None           ENDOSCOPIC IMPRESSION:     1) Moderate diverticulosis in the sigmoid  to descending colon     2) Diverticula, scattered in the transverse colon           RECOMMENDATIONS:     1) High fiber diet with liberal fluid intake.     2) Repeat Colonoscopy in 5 years.           Venita Lick. Russella Dar, MD, [facility]           CC: Lindley Magnus, MD           n.     Rosalie DoctorVenita Lick. Stark at 11/02/2009 10:25 AM           Esmond Harps, [phone]  Note: An exclamation mark (!) indicates a result that was not dispersed into the flowsheet. Document Creation Date: 11/02/2009 10:26 AM _______________________________________________________________________  (1) Order result status: Final Collection or observation date-time: 11/02/2009 10:22 Requested date-time:  Receipt date-time:  Reported date-time:  Referring Physician:   Ordering Physician: Claudette Head [phone]) Specimen Source:  Source: Launa Grill Order Number: [id number] Lab site:   Appended Document: Colonoscopy    Clinical Lists Changes  Observations: Added new observation of COLONNXTDUE: 10/2014 (11/02/2009 15:17)

## 2010-03-06 NOTE — Letter (Signed)
Summary: [facility] Instructions  [facility]  [address] [place], [place] [address]   Phone: [phone]  Fax: [phone]       MARRI [name]    [birth year]    MRN: [id number]        Procedure Day /Date:  Thursday 11/02/2009     Arrival Time:  9:00 am      Procedure Time: 10:00 am     Location of Procedure:                    _x _  [facility] (4th Floor)                        PREPARATION FOR COLONOSCOPY WITH MOVIPREP   Starting 5 days prior to your procedure Saturday 9/24 do not eat nuts, seeds, popcorn, corn, beans, peas,  salads, or any raw vegetables.  Do not take any fiber supplements (e.g. Metamucil, Citrucel, and Benefiber).  THE DAY BEFORE YOUR PROCEDURE         DATE: Wednesday 9/28  1.  Drink clear liquids the entire day-NO SOLID FOOD  2.  Do not drink anything colored red or purple.  Avoid juices with pulp.  No orange juice.  3.  Drink at least 64 oz. (8 glasses) of fluid/clear liquids during the day to prevent dehydration and help the prep work efficiently.  CLEAR LIQUIDS INCLUDE: Water Jello Ice Popsicles Tea (sugar ok, no milk/cream) Powdered fruit flavored drinks Coffee (sugar ok, no milk/cream) Gatorade Juice: apple, white grape, white cranberry  Lemonade Clear bullion, consomm, broth Carbonated beverages (any kind) Strained chicken noodle soup Hard Candy                             4.  In the morning, mix first dose of MoviPrep solution:    Empty 1 Pouch A and 1 Pouch B into the disposable container    Add lukewarm drinking water to the top line of the container. Mix to dissolve    Refrigerate (mixed solution should be used within 24 hrs)  5.  Begin drinking the prep at 5:00 p.m. The MoviPrep container is divided by 4 marks.   Every 15 minutes drink the solution down to the next mark (approximately 8 oz) until the full liter is complete.   6.  Follow completed prep with 16 oz of clear liquid of your choice  (Nothing red or purple).  Continue to drink clear liquids until bedtime.  7.  Before going to bed, mix second dose of MoviPrep solution:    Empty 1 Pouch A and 1 Pouch B into the disposable container    Add lukewarm drinking water to the top line of the container. Mix to dissolve    Refrigerate  THE DAY OF YOUR PROCEDURE      DATE:  Thursday 9/29  Beginning at 5:00 am (5 hours before procedure):         1. Every 15 minutes, drink the solution down to the next mark (approx 8 oz) until the full liter is complete.  2. Follow completed prep with 16 oz. of clear liquid of your choice.    3. You may drink clear liquids until 8:00 am (2 HOURS BEFORE PROCEDURE).   MEDICATION INSTRUCTIONS  Unless otherwise instructed, you should take regular prescription medications with a small sip of water   as early as possible the  morning of your procedure.          OTHER INSTRUCTIONS  You will need a responsible adult at least 50 years of age to accompany you and drive you home.   This person must remain in the waiting room during your procedure.  Wear loose fitting clothing that is easily removed.  Leave jewelry and other valuables at home.  However, you may wish to bring a book to read or  an iPod/MP3 player to listen to music as you wait for your procedure to start.  Remove all body piercing jewelry and leave at home.  Total time from sign-in until discharge is approximately 2-3 hours.  You should go home directly after your procedure and rest.  You can resume normal activities the  day after your procedure.  The day of your procedure you should not:   Drive   Make legal decisions   Operate machinery   Drink alcohol   Return to work  You will receive specific instructions about eating, activities and medications before you leave.    The above instructions have been reviewed and explained to me by   Brenda Sites RN  October 19, 2009 8:33 AM    I fully understand and  can verbalize these instructions _____________________________ Date _________

## 2010-03-08 NOTE — Assessment & Plan Note (Signed)
Summary: lump in throat area/some pain/cjr---PT [facility] // RS   Vital Signs:  Patient profile:   50 year old female Menstrual status:  hysterectomy Weight:      189 pounds Temp:     98.5 degrees F oral BP sitting:   126 / 80  (left arm) Cuff size:   regular  Vitals Entered By: Alfred Levins, CMA (January 12, 2010 10:31 AM) CC: lump on side of neck, bilateral hip pain   Primary Care Ariadna Setter:  Birdie Sons, MD  CC:  lump on side of neck and bilateral hip pain.  History of Present Illness: throat--clearing all of the tme  hip pain--- bilaterally---one month ldft> right  All other systems reviewed and were negative     Current Medications (verified): 1)  Metoprolol Tartrate 50 Mg Tabs (Metoprolol Tartrate) .Marland Kitchen.. 1 By Mouth Two Times A Day 2)  Lorazepam 0.5 Mg Tabs (Lorazepam) .... 1/2-1 By Mouth Once Daily As Needed Anxiety  Allergies (verified): 1)  ! Amlodipine Besylate (Amlodipine Besylate) 2)  Lisinopril-Hydrochlorothiazide (Lisinopril-Hydrochlorothiazide) 3)  Codeine Sulfate (Codeine Sulfate) 4)  Demerol (Meperidine Hcl)  Past History:  Past Medical History: Last updated: 10/04/2009 Hypertension GERD Hemorrhoids Diverticulosis Anal Fissure IBS Hyperlipidemia  Past Surgical History: Last updated: 03/08/2008 rectal surgery--thrombosis/fissure cyst removal from neck laproscopy Appendectomy Breast lumpectomy Tonsillectomy  Family History: Last updated: 03/09/2008 mother--alive and well father deceased colon CA at age 82 sister alive and well Family History Diabetes 1st degree relative--DM -Mother  Social History: Last updated: 03/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  Daily Caffeine Use -1 Patient gets regular exercise.  Risk Factors: Exercise: yes (03/09/2008)  Risk Factors: Smoking Status: quit < 6 months (10/04/2009)  Physical Exam  General:  alert and well-developed.  neck supple  without lymphadenopathy or thyromegaly. No masses. Carotid upstrokes normal. Chest clear to auscultation cardiac exam S1-S2 are regular. She is full range of motion both hips bilaterally.   Impression & Recommendations:  Problem # 1:  URI (ICD-465.9) palpate her symptoms are indicative of any significant illness. I've asked her to monitor this. Call me for any other concerns or persistence. Her updated medication list for this problem includes:    Ibuprofen 200 Mg Caps (Ibuprofen) .Marland KitchenMarland KitchenMarland KitchenMarland Kitchen 3 by mouth with each meal for 10 days  Problem # 2:  HIP PAIN (ICD-719.45) will try nonsteroidals. That doesn't work she'll call me. Her updated medication list for this problem includes:    Ibuprofen 200 Mg Caps (Ibuprofen) .Marland KitchenMarland KitchenMarland KitchenMarland Kitchen 3 by mouth with each meal for 10 days  Complete Medication List: 1)  Metoprolol Tartrate 50 Mg Tabs (Metoprolol tartrate) .Marland Kitchen.. 1 by mouth two times a day 2)  Lorazepam 0.5 Mg Tabs (Lorazepam) .... 1/2-1 by mouth once daily as needed anxiety 3)  Ibuprofen 200 Mg Caps (Ibuprofen) .... 3 by mouth with each meal for 10 days 4)  Omnaris 50 Mcg/act Susp (Ciclesonide) .Marland Kitchen.. 1 spray each nostril daily  Patient Instructions: 1)  . Prescriptions: OMNARIS 50 MCG/ACT SUSP (CICLESONIDE) 1 spray each nostril daily  #1 x 1   Entered and Authorized by:   Birdie Sons MD   Signed by:   Birdie Sons MD on 01/12/2010   Method used:   Electronically to        [facility] [address] [id number]* (retail)       [address] [place], [place]  [address]       Ph: [phone]  Fax: [phone]   RxID:   [id number]    Orders Added: 1)  Est. Patient Level III [56433]

## 2010-03-09 ENCOUNTER — Ambulatory Visit
Admission: RE | Admit: 2010-03-09 | Discharge: 2010-03-09 | Disposition: A | Discharge status: Home / Self Care | Payer: 59 | Source: Ambulatory Visit | Attending: Obstetrics and Gynecology | Admitting: Obstetrics and Gynecology

## 2010-03-09 DIAGNOSIS — Z1239 Encounter for other screening for malignant neoplasm of breast: Secondary | ICD-10-CM

## 2010-03-14 ENCOUNTER — Encounter: Payer: Self-pay | Admitting: Cardiology

## 2010-03-16 ENCOUNTER — Other Ambulatory Visit (INDEPENDENT_AMBULATORY_CARE_PROVIDER_SITE_OTHER): Payer: 59 | Admitting: Internal Medicine

## 2010-03-16 DIAGNOSIS — E785 Hyperlipidemia, unspecified: Secondary | ICD-10-CM

## 2010-03-16 DIAGNOSIS — I10 Essential (primary) hypertension: Secondary | ICD-10-CM

## 2010-03-16 DIAGNOSIS — E119 Type 2 diabetes mellitus without complications: Secondary | ICD-10-CM

## 2010-03-16 DIAGNOSIS — T887XXA Unspecified adverse effect of drug or medicament, initial encounter: Secondary | ICD-10-CM

## 2010-03-16 LAB — LIPID PANEL
Total CHOL/HDL Ratio: 3
Triglycerides: 122 mg/dL (ref 0.0–149.0)
VLDL: 24.4 mg/dL (ref 0.0–40.0)

## 2010-03-16 LAB — BASIC METABOLIC PANEL
Chloride: 103 mEq/L (ref 96–112)
Creatinine, Ser: 0.9 mg/dL (ref 0.4–1.2)
GFR: 91.11 mL/min (ref >60.00)
Potassium: 3.4 mEq/L — ABNORMAL LOW (ref 3.5–5.1)

## 2010-03-16 LAB — HEPATIC FUNCTION PANEL
ALT: 18 U/L (ref 0–35)
Bilirubin, Direct: 0.1 mg/dL (ref 0.0–0.3)
Total Bilirubin: 0.6 mg/dL (ref 0.3–1.2)

## 2010-03-16 LAB — HEMOGLOBIN A1C: Hgb A1c MFr Bld: 6.3 % (ref 4.6–6.5)

## 2010-03-20 ENCOUNTER — Institutional Professional Consult (permissible substitution) (INDEPENDENT_AMBULATORY_CARE_PROVIDER_SITE_OTHER): Payer: 59 | Admitting: Cardiology

## 2010-03-20 DIAGNOSIS — R079 Chest pain, unspecified: Secondary | ICD-10-CM

## 2010-03-20 DIAGNOSIS — I10 Essential (primary) hypertension: Secondary | ICD-10-CM

## 2010-03-30 ENCOUNTER — Encounter: Payer: Self-pay | Admitting: Internal Medicine

## 2010-04-02 ENCOUNTER — Encounter: Payer: Self-pay | Admitting: Internal Medicine

## 2010-04-02 ENCOUNTER — Ambulatory Visit (INDEPENDENT_AMBULATORY_CARE_PROVIDER_SITE_OTHER): Payer: 59 | Admitting: Internal Medicine

## 2010-04-02 ENCOUNTER — Telehealth (INDEPENDENT_AMBULATORY_CARE_PROVIDER_SITE_OTHER): Payer: Self-pay | Admitting: Radiology

## 2010-04-02 VITALS — BP 124/80 | Temp 99.4°F | Ht 65.0 in | Wt 192.0 lb

## 2010-04-02 DIAGNOSIS — Z0000 Encounter for general adult medical examination without abnormal findings: Secondary | ICD-10-CM

## 2010-04-02 MED ORDER — LORAZEPAM 0.5 MG PO TABS: 0.5000 mg | ORAL_TABLET | Freq: Three times a day (TID) | ORAL | Status: DC

## 2010-04-02 MED ORDER — METOPROLOL TARTRATE 50 MG PO TABS: 50.0000 mg | ORAL_TABLET | Freq: Two times a day (BID) | ORAL | Status: DC

## 2010-04-02 NOTE — Progress Notes (Signed)
  Subjective:     AERICA Melendez is a 50 y.o. female and is here for a comprehensive physical exam. The patient reports had a recent cough and chest pain. saw dr Swaziland and is scheduled for stress test tomorrow.  History   Social History  . Marital Status: Single    Spouse Name: N/A    Number of Children: N/A  . Years of Education: N/A   Occupational History  . Not on file.   Social History Main Topics  . Smoking status: Former Smoker    Quit date: 04/02/1980  . Smokeless tobacco: Not on file  . Alcohol Use: Yes  . Drug Use: Not on file  . Sexually Active: Not on file   Other Topics Concern  . Not on file   Social History Narrative  . No narrative on file   Health Maintenance  Topic Date Due  . Pap Smear  05/23/1978  . Tetanus/tdap  07/31/2016    The following portions of the patient's history were reviewed and updated as appropriate: allergies, current medications, past family history, past medical history, past social history, past surgical history and problem list.  Review of Systems   patient denies chest pain, shortness of breath, orthopnea. Denies lower extremity edema, abdominal pain, change in appetite, change in bowel movements. Patient denies rashes, musculoskeletal complaints. No other specific complaints in a complete review of systems.    Objective:    Well-developed well-nourished female in no acute distress. HEENT exam atraumatic, normocephalic, extraocular muscles are intact. Neck is supple. No jugular venous distention no thyromegaly. Chest clear to auscultation without increased work of breathing. Cardiac exam S1 and S2 are regular. Abdominal exam active bowel sounds, soft, nontender. Extremities no edema. Neurologic exam she is alert without any motor sensory deficits. Gait is normal. Gyn care by dr Henderson Cloud    Assessment:    Healthy female exam.  Health maint utd      Plan:  Health maint utd She has stress test tomorrow  HTN: controlled     See After Visit Summary for Counseling Recommendations

## 2010-04-03 ENCOUNTER — Encounter: Payer: Self-pay | Admitting: Cardiovascular Disease

## 2010-04-03 ENCOUNTER — Ambulatory Visit (HOSPITAL_COMMUNITY): Payer: 59 | Attending: Cardiology

## 2010-04-03 DIAGNOSIS — R0789 Other chest pain: Secondary | ICD-10-CM

## 2010-04-03 DIAGNOSIS — R079 Chest pain, unspecified: Secondary | ICD-10-CM | POA: Insufficient documentation

## 2010-04-09 ENCOUNTER — Other Ambulatory Visit: Payer: Self-pay | Admitting: Internal Medicine

## 2010-04-12 NOTE — Assessment & Plan Note (Signed)
Summary: Cardiology Nuclear Testing  Nuclear Med Background Indications for Stress Test: Evaluation for Ischemia    History Comments: No documented CAD  Symptoms: Chest Pain, Chest Pain with Exertion, Chest Pressure, Chest Pressure with Exertion, Dizziness, DOE, Fatigue, Near Syncope, Rapid HR, SOB  Symptoms Comments: Last episode of CP:2 weeks ago.   Nuclear Pre-Procedure Cardiac Risk Factors: History of Smoking, Hypertension, NIDDM, Overweight Caffeine/Decaff Intake: none NPO After: 7:30 PM Lungs: Clear. IV 0.9% NS with Angio Cath: 22g     IV Site: R Hand IV Started by: Cathlyn Parsons, RN Chest Size (in) 36     Cup Size B     Height (in): 66 Weight (lb): 189 BMI: 30.62 Tech Comments: Metoprolol held x 16 hours.  Nuclear Med Study 1 or 2 day study:  1 day     Stress Test Type:  Stress Reading MD:  Charlton Haws, MD     Referring MD:  Peter Swaziland, MD Resting Radionuclide:  Technetium 71m Tetrofosmin     Resting Radionuclide Dose:  10.7 mCi  Stress Radionuclide:  Technetium 47m Tetrofosmin     Stress Radionuclide Dose:  33 mCi   Stress Protocol Exercise Time (min):  6:00 min     Max HR:  153 bpm     Predicted Max HR:  171 bpm  Max Systolic BP: 209 mm Hg     Percent Max HR:  89.47 %     METS: 7.0 Rate Pressure Product:  16109    Stress Test Technologist:  Rea [employer], CMA-N     Nuclear Technologist:  Doyne Keel, CNMT  Rest Procedure  Myocardial perfusion imaging was performed at rest 45 minutes following the intravenous administration of Technetium 13m Tetrofosmin.  Stress Procedure  The patient exercised for six minutes on the treadmill utilizing the Bruce protocol.  The patient stopped due to fatigue and denied any chest pain.  There were no diagnostic ST-T wave changes, only occasional PVC's and fusions beats.  She had a mild hypertensive response to exercise, 200/101 and 209/100.  Technetium 40m Tetrofosmin was injected at peak exercise and myocardial perfusion  imaging was performed after a brief delay.  QPS Raw Data Images:  Normal; no motion artifact; normal heart/lung ratio. Stress Images:  Normal homogeneous uptake in all areas of the myocardium. Rest Images:  Normal homogeneous uptake in all areas of the myocardium. Subtraction (SDS):  Normal Transient Ischemic Dilatation:  1.02  (Normal <1.22)  Lung/Heart Ratio:  0.24  (Normal <0.45)  Quantitative Gated Spect Images QGS EDV:  98 ml QGS ESV:  36 ml QGS EF:  63 % QGS cine images:  normal  Findings Normal nuclear study      Overall Impression  Exercise Capacity: Fair exercise capacity. BP Response: Hypertensive blood pressure response. Clinical Symptoms: Dyspnea ECG Impression: No significant ST segment change suggestive of ischemia. Overall Impression: Normal stress nuclear study.

## 2010-04-12 NOTE — Progress Notes (Signed)
Summary: Nuclear Pre-Procedure  Phone Note Outgoing Call Call back at [facility] Phone [phone]   Call placed by: Stanton Kidney, EMT-P,  April 02, 2010 11:19 AM Call placed to: Patient Action Taken: Phone Call Completed Reason for Call: Confirm/change Appt Summary of Call: Left message with information on [employer] Information Sheet (see scanned document for details). Stanton Kidney, EMT-P  April 02, 2010 11:19 AM      Nuclear Med Background Indications for Stress Test: Evaluation for Ischemia     Symptoms: Chest Pain, Chest Pressure, DOE, SOB  Symptoms Comments: Atypical CP; SOB assoc. with CP   Nuclear Pre-Procedure Cardiac Risk Factors: Family History - CAD, Hypertension, NIDDM Height (in): 66

## 2010-05-13 LAB — COMPREHENSIVE METABOLIC PANEL
AST: 22 U/L (ref 0–37)
BUN: 7 mg/dL (ref 6–23)
CO2: 25 mEq/L (ref 19–32)
Calcium: 8.9 mg/dL (ref 8.4–10.5)
Chloride: 106 mEq/L (ref 96–112)
Creatinine, Ser: 0.8 mg/dL (ref 0.4–1.2)
GFR calc Af Amer: >60 mL/min (ref >60)
GFR calc non Af Amer: >60 mL/min (ref >60)
Total Bilirubin: 0.4 mg/dL (ref 0.3–1.2)

## 2010-05-13 LAB — POCT CARDIAC MARKERS
CKMB, poc: <1 ng/mL — ABNORMAL LOW (ref 1.0–8.0)
Myoglobin, poc: 53.9 ng/mL (ref 12–200)
Troponin i, poc: <0.05 ng/mL (ref 0.00–0.09)

## 2010-05-13 LAB — DIFFERENTIAL
Basophils Absolute: 0.1 10*3/uL (ref 0.0–0.1)
Eosinophils Relative: 1 % (ref 0–5)
Lymphocytes Relative: 34 % (ref 12–46)
Lymphs Abs: 3.3 10*3/uL (ref 0.7–4.0)
Neutro Abs: 5.4 10*3/uL (ref 1.7–7.7)
Neutrophils Relative %: 57 % (ref 43–77)

## 2010-05-13 LAB — CBC
Hemoglobin: 12.7 g/dL (ref 12.0–15.0)
MCHC: 32.3 g/dL (ref 30.0–36.0)
RBC: 4.27 MIL/uL (ref 3.87–5.11)
RDW: 13.8 % (ref 11.5–15.5)

## 2010-06-01 ENCOUNTER — Other Ambulatory Visit: Payer: Self-pay

## 2010-06-19 NOTE — Op Note (Signed)
NAMEKAYIN, KETTERING NO.:  [id number]   MEDICAL RECORD NO.:  [id number]          PATIENT TYPE:  AMB   LOCATION:  SDC                           FACILITY:  [facility]   PHYSICIAN:  Carrington Clamp, M.D. DATE OF BIRTH:  [birth year]   DATE OF PROCEDURE:  04/29/2007  DATE OF DISCHARGE:                               OPERATIVE REPORT   PREOPERATIVE DIAGNOSIS:  Stress urinary continence with uterine  descensus and cystocele.   POSTOPERATIVE DIAGNOSIS:  Stress urinary continence with uterine  descensus and cystocele.   PROCEDURE:  Total vaginal hysterectomy with tension-free vaginal tape,  cystoscopy and anterior repair.   SURGEON:  Carrington Clamp, M.D.   ASSISTANT:  Dr. Edward Jolly.   ANESTHESIA:  General.   FINDINGS:  About an eight-week size uterus with normal cervix.  The  Filshie clips could be seen on the normal fallopian tubes.  They were  still in place.  There was a 3 cm serocyst on the left ovary that  appeared completely normal.  There was normal right ovary. There was  bilateral spill from the ureteral orifices of indigo carmine.  After the  first placement of the needles there was a right dome double puncture  which was corrected and replaced.  On the second cystoscopy there was no  needles in the bladder and the cystotomy area was stable and not  bleeding.   ESTIMATED BLOOD LOSS:  600 mL.   IV FLUIDS:  2300 mL   URINE OUTPUT:  Positive but not measured.   SPECIMENS:  Uterine and cervix to pathology.   COMPLICATIONS:  Right dome double perforation with the needle which was  corrected so that there were no needles or tape inside the bladder.   MEDICATIONS:  Enfamil in the bladder.  Indigo carmine 0.5%.  Xylocaine  with epinephrine and Gelfoam.  Esterase cream.   COUNTS:  Correct x3.   TECHNIQUE:  After adequate general anesthesia was achieved, the patient  was prepped and draped in the usual sterile fashion in dorsal lithotomy  position.  The bladder  was emptied with a catheter and 60 mL of Enfamil  was then placed in the bladder.  The cervix was grasped with a pair of  Laheys and the cervix injected with 50% Xylocaine with epinephrine.  A  circumferential incision was then made with the scalpel.  The Mayo  scissors were used to enter into the posterior cul-de-sac and the long  duckbill retractor placed.  The uterosacrals were then grasped  bilaterally with a pair of Heaneys and each pedicle was incised with the  Mayo scissors, secured with a Heaney stitch and secured with a Heaney  stitch.  The bladder was then dissected off of the anterior cervix with  the Metzenbaums until the peritoneum could be entered into and the  bladder retracted with a Deaver retractor.  Alternating successive bites  of the Heaney clamp were used to secure the cardinal ligament.  Each  bite was incised with the Mayo scissors and secured with a stitch of 0  Vicryl.  This continued up through the broad ligament and  finally a pair  of Heaneys were placed on the uterine ovarian ligament.  Each of these  pedicles was secured with a freehand tie of 0 Vicryl and then with a  stitch of 0 Vicryl.  The Filshie clips were left in place and the  ovaries seen and appeared completely normal including the serocyst.  The  pedicles were dry and hemostatic.  The anterior peritoneum was then  secured with a stitch of 2-0 Vicryl which then incorporated the left  uterosacral, went through the posterior cul-de-sac, back through the  vagina into the posterior cul-de-sac, pursestring modified Halban's  culdoplasty again through the vagina and back through into the way back  into the posterior cul-de-sac, through left uterosacral and then through  the anterior peritoneum.  This was cinched down and pursestring fashion  and tied without complication.   Attention was then turned to the anterior vaginal wall which was grasped  with multiple Allises in the midline.  The vaginal mucosa  was injected  with 50% Xylocaine with epinephrine.  The scalpel was used to incise the  midline of the anterior vaginal wall.  The vaginal mucosa was then  reflected off of the vesicouterine fascia with sharp dissection with the  Mayo scissors until the pelvic diaphragm could be felt on either side of  the pubic bone and urethra and enough space had been achieved to locate  the strong vesicouterine fascia.   Two stab incisions were made 2 cm off of midline suprapubically.  The  Gynecare tension-free vaginal tape, abdominal needles were then passed  close to the pubic bone and through the pelvic diaphragm on either side  of the urethra.  The cystoscopy was then performed.  The left needle was  correctly placed, however, the right needle had punctured the dome in  two places.  There was a significant amount of bleeding once the needle  had been removed, but this slowed down quite a bit after the needle had  been replaced and cystoscopy performed again.  Cystoscopy revealed,  after the second placement of the right-hand needle, the needle was in  the correct place and not anywhere near the bladder or the urethra.   The tension-free vaginal tape vaginal needles were then attached to the  abdominal needles and the tension-free vaginal tape pulled up to  attention that allowed a Kelly to pass between the urethra and the tape.  The sheaths were removed and the tape cut below the skin level.  The  skin incisions were Dermabonded and the tape checked and found to be in  correct placement and flat.   The vesicouterine fascia was then closed with three mattress stitches of  0 Vicryl.  Two additional stitches of 2-0 Vicryl were placed near the  tension-free vaginal tape to ensure hemostasis.  The right-hand side of  the tape had been incorporated in the stitch but it was felt that it  would not inhibit the tape in any way at all.  The bleeding had slowed  at this point quite a bit but there was  still some oozing from those  areas.  A piece of Gelfoam was placed on either side of the tension-free  vaginal tape and the vaginal mucosa trimmed.  The vaginal mucosa was  closed with a running locked stitch of 2-0 Vicryl.  The cuff was closed  with interrupted figure-of-eight stitches of 0 Vicryl.  The vagina was  then packed with two inch plain Nu-gauze with Estrace cream.  The Foley  catheter had been placed before the procedure for the tension-free  vaginal tape and had been replaced after the cystoscopy and will remain  in place.  Because of the cystotomy, it has been decided to leave the  Foley catheter in place for seven days and forego  the suprapubic catheter as it will be likely that the patient will be  able to void on her own after 7-10 days and not have to undergo multiple  catheterizations.  This complication will be explained to the patient  and family.  The patient was returned to the recovery room in stable  condition.      Carrington Clamp, M.D.  Electronically Signed     MH/MEDQ  D:  04/29/2007  T:  04/29/2007  Job:  [id number]

## 2010-06-22 NOTE — Discharge Summary (Signed)
NAMEKATRIONA, [name]               ACCOUNT NO.:  [id number]   MEDICAL RECORD NO.:  [id number]          PATIENT TYPE:  INP   LOCATION:  9311                          FACILITY:  [facility]   PHYSICIAN:  Carrington Clamp, M.D. DATE OF BIRTH:  11/08/1960   DATE OF ADMISSION:  04/29/2007  DATE OF DISCHARGE:  05/01/2007                               DISCHARGE SUMMARY   ADMITTING DIAGNOSIS:  Worsening stress urinary incontinence.   DISCHARGE DIAGNOSIS:  Worsening stress urinary incontinence.   PERTINENT PROCEDURES PERFORMED:  Total vaginal hysterectomy with tension-  free vaginal tape and anterior repair.   Pregnant test results were postop H&H of 10.9 and 31.8.   HISTORY AND PHYSICAL:  This is a 50 year old G2, P1 complaining of  worsening stress urinary incontinence over the last year.  The patient  had had undergone an endometrial ablation and tubal ligation on December 31, 2005, but since has had progressively worsening stress urinary  incontinence.  She leaks urine with cough, laughing, and sneezing.  She  spontaneously again wears a pad.   PAST MEDICAL HISTORY:  Significantly for mild hypertension controlled  with medications.   PAST SURGICAL HISTORY:  The patient had undergone appendectomy,  bilateral tubal ligations, NovaSure ablation.   PAST OB HISTORY:  Spontaneous vaginal delivery x1.   PRESENT HISTORY:  Negative for sexually transmitted diseases.   SOCIAL HISTORY:  The patient does not smoke.   ALLERGIES:  DEMEROL and CODEINE, but the patient could take morphine and  Percocet, and also allergy to LISINOPRIL.   MEDICATIONS:  Metoprolol 50 mg p.o. b.i.d.   PHYSICAL EXAM:  VITAL SIGNS:  Temperature is 36.36 and blood pressure is  115/78.  GENERAL:  Within normal limits.  HEENT:  Anicteric.  NECK:  No thyromegaly.  CHEST:  Clear to auscultation bilaterally.  BREAST:  Without masses.  HEART:  Regular rate and rhythm.  ABDOMEN:  Soft, nontender, and nondistended.  PELVIS:   Normal external female genitalia.  Cystocele 2+ presentation.  Rectocele -2.  (The patient did not complaining of splinting).  Positive  descensus of the uterus.  Positive spontaneous leak.  Uterus less than 7  cm, mid, without masses.  EXTREMITIES:  Benign.  SKIN:  Benign.  NEUROLOGIC:  Benign.  SYSTEMIC:  Revealed blood pressure 97 with a normal compliance.  There  was possible resistant to the catheter to urethra and this was addressed  by Dr. Retta Diones.  She had been seen by Dr. Retta Diones preoperatively for  evaluation to make sure TVT would not worsen her condition.  The patient  had no voiding pressures and therefore we elected to proceed with a TVT.   IMPRESSION:  Stress urinary incontinence with descensus and cystocele.   PLAN:  To do a total vaginal hysterectomy and tension-free vaginal tape  anterior repair.  All risks, benefits, alternatives had been discussed  with the patient.  She wished to proceed.   HOSPITAL COURSE:  The patient underwent above-named procedures on April 29, 2007 without complications.  The bladder had been perforated with a  needle and the needle had been removed and  replaced and the needles were  definitely not inside the bladder at the time of insertion of this  tension-free vaginal tape.  Postop day #2, the patient was eating and  ambulating.  Vaginal pack had been removed.  The patient was discharged  home with the following.  The patient had Foley draining to gravity, and  the patient needed this for 7 days secondary to the bladder perforation.  Diet was increased fiber and water.   ACTIVITY:  Walk with assistance.  No driving for 2 weeks.  No lifting  for 6 weeks. No sexual activities for 6 weeks.  The patient may shower,  but no bath.  She may walk up steps.   MEDICATIONS:  1. Percocet 5 mg 1 p.o. q.4-6 h. p.r.n. pain.  2. Colace over the counter.  3. Motrin over the counter.  4. Ceftin 250 mg 1 p.o. b.i.d. x7 days secondary to  catheter.   FOLLOWUP:  Follow up is with Dr. Henderson Cloud in 7 days.      Carrington Clamp, M.D.  Electronically Signed     MH/MEDQ  D:  06/02/2007  T:  06/03/2007  Job:  [id number]

## 2010-06-22 NOTE — Op Note (Signed)
NAMEGLYNN, [name] NO.:  [id number]   MEDICAL RECORD NO.:  [id number]          PATIENT TYPE:  AMB   LOCATION:  SDC                           FACILITY:  [facility]   PHYSICIAN:  Carrington Clamp, M.D. DATE OF BIRTH:  March 25, 1960   DATE OF PROCEDURE:  12/13/2005  DATE OF DISCHARGE:                               OPERATIVE REPORT   PREOPERATIVE DIAGNOSIS:  Menorrhagia, dysmenorrhea, multiparous, denies  permanent sterility, right lower quadrant pain.   POSTOPERATIVE DIAGNOSIS:  Menorrhagia, dysmenorrhea, multiparous, denies  permanent sterility, right lower quadrant pain, adhesions.   PROCEDURE:  Diagnostic laparoscopy with Filshie clip bilateral tubal  ligation, lysis of adhesions, hysteroscopy, dilatation and curettage,  NovaSure ablation.   SURGEON:  Carrington Clamp, M.D.   ASSISTANT:  None.   ANESTHESIA:  General.   SPECIMENS:  Uterine curettings.   ESTIMATED BLOOD LOSS:  Minimal.   FLUIDS REPLACED:  2000 mL.   URINE OUTPUT:  Not measured.   COMPLICATIONS:  None.   FINDINGS:  Nine weeks size uterus, bulky, with multiple small, tiny  fibroids, and boggy.  There were no endometrial polyps, although there  were some small fibroids seen in the endocervical canal.  Uterus sounded  to 10 cm with 5 cm cervix giving a cavity length of 5 cm, width was 4.3,  power was 118 watts, time 59 seconds.  There was good contact of all  areas intrauterine after the NovaSure ablation and the uterus was intact  postoperatively.  Filshie clips were seen all the way around the tubes.   COUNTS:  Correct x3.   TECHNIQUE:  After adequate general anesthesia was achieved, the patient  was prepped and draped in the usual sterile fashion in the dorsal  lithotomy position.  A uterine manipulator was placed in the vagina.  The bladder was emptied with a red rubber catheter.  Attention was  turned to the abdomen.  A 2 cm infraumbilical incision was made with the  scalpel and the  abdomen insufflated with the Veress needle. There was no  aspiration of bowel contents or blood.  A 10 mm trocar was placed  without complication into the abdomen and a 5 mm trocar placed under  direct visualization of the camera.  The above findings were noted and  the Filshie clips were placed around the isthmic portion of each tube.  Lysis of adhesions of the left descending sigmoid and prior appendectomy  site were taken care of with triple polar cautery.  The bowel was  brought out of the field of dissection during all surgeries.  The  abdomen was desufflated after irrigation had been performed.   Attention was turned to the vagina where a speculum was placed in the  vagina and the uterine manipulator removed.  A single tooth tenaculum  was used to grasp the cervix and the cervix was measured at 5 cm with  the [facility] dilator.  The hysteroscope was passed into the uterine cavity  after the cervix had been dilated up with [facility] dilators.  The above  findings were noted and the hysteroscope was withdrawn and a vigorous  sharp curettage was performed.  The NovaSure instrument was placed in  the uterine cavity and the CO2 test passed without complications and the  procedure performed for 59 seconds, again without complications.  The  hysteroscope was passed back into the uterine cavity and good contact of  all areas was noted.  Everything was then withdrawn from the vagina and  attention was turned to the abdomen where the laparoscopic camera was  placed again inside the abdomen and the above findings again concerned.  All  instruments were then withdrawn from the abdomen, the abdomen  desufflated.  The 10 mm trocar site fascia was closed with figure-of-  eight suture of 2-0 Vicryl.  4-0 Vicryl was used to close the suprapubic  incision skin with a through and through stitch.  Both incisions were  closed with Dermabond.  All instruments were withdrawn from the vagina  and the patient  tolerated the procedure well and was returned to the  recovery room in stable condition.      Carrington Clamp, M.D.  Electronically Signed     MH/MEDQ  D:  12/13/2005  T:  12/13/2005  Job:  [id number]

## 2010-06-22 NOTE — Op Note (Signed)
[name], [name]                           ACCOUNT NO.:  [id number]   MEDICAL RECORD NO.:  [id number]                   PATIENT TYPE:  INP   LOCATION:  0102                                 [facility]ACILITY[facility]  [facility]   PHYSICIAN:  Sharlet Salina T. Hoxworth, M.D.          DATE OF BIRTH:  [birth year]   DATE OF PROCEDURE:  10/26/2001  DATE OF DISCHARGE:                                 OPERATIVE REPORT   PREOPERATIVE DIAGNOSIS:  Acute appendicitis.   POSTOPERATIVE DIAGNOSIS:  Acute appendicitis.   SURGICAL PROCEDURE:  Laparoscopic appendectomy.   SURGEON:  Lorne Skeens. Hoxworth, M.D.   ANESTHESIA:  General.   BRIEF HISTORY:  The patient is a 50 year old black female, who presents with  a 24 hour history of classic symptoms of appendicitis.  She has right lower  quadrant tenderness with rebound and elevated white count.  She is felt to  have acute appendicitis, and laparoscopic and possible open appendectomy  have been recommended and accepted.  The nature of the procedure,  indications, the risks of bleeding and infection were discussed and  understood preoperatively.  She is now brought to the operating room for  this procedure.   DESCRIPTION OF OPERATION:  The patient brought to the operating room and  placed in supine position on the operating table, and general endotracheal  anesthesia was induced.  She had received preoperative broad-spectrum  antibiotics.  Foley catheter was placed.  The abdomen was sterilely prepped  and draped.  Local anesthesia was used to infiltrate the trocar sites prior  to the incisions.  A 1 cm incision was made at the umbilicus and dissection  carried down the midline fascia.  This was sharply incised for 1 cm and the  peritoneum entered under direct vision.  Through a mattress suture of 0  Vicryl, the Hasson trocar was placed and pneumoperitoneum established.  Under direct vision, the 5 mm trocar was placed in the right upper quadrant,  a 12 mm trocar in the  left lower quadrant.  The appendix was exposed and was  acutely inflamed distally without perforation or gangrene.  The appendix was  elevated, and the base of the appendix and mesoappendix clearly defined.  The mesoappendix was taken down with the harmonic scalpel, completely  freeing the appendix down to its base which was then divided with a single  firing of the 3.5 mm EndoGIA stapler.  The appendix was placed in an  EndoCatch bag and brought out through the umbilicus.  The right lower  quadrant was irrigated and suctioned, and complete hemostasis was assured.  Trocars were removed under direct vision.  All CO2 was evacuated from the  peritoneal cavity.  The mattress suture was secured at the umbilicus.  Skin  incisions were closed with interrupted subcuticular 4-0 Monocryl and Steri-  Strips.  Sponge, needle, and instrument counts were correct.  Dry sterile  dressings were applied and the patient taken to recovery  in good condition.                                               Lorne Skeens. Hoxworth, M.D.    Tory Emerald  D:  10/26/2001  T:  10/27/2001  Job:  [id number]

## 2010-06-22 NOTE — Op Note (Signed)
[name], [name]               ACCOUNT NO.:  [id number]   MEDICAL RECORD NO.:  [id number]          PATIENT TYPE:  AMB   LOCATION:  NESC                         FACILITY:  [facility]   PHYSICIAN:  Sharlet Salina T. Hoxworth, M.D.DATE OF BIRTH:  10/07/1960   DATE OF PROCEDURE:  06/26/2004  DATE OF DISCHARGE:                                 OPERATIVE REPORT   PREOPERATIVE DIAGNOSIS:  Subcutaneous mass, left axilla.   POSTOPERATIVE DIAGNOSIS:  Subcutaneous mass, left axilla.   SURGICAL PROCEDURE:  Excision of subcutaneous mass, left axilla.   SURGEON:  Dr. Johna Sheriff   ANESTHESIA:  Local IV sedation.   BRIEF HISTORY:  [name] is a 50 year old female, who presents with a  palpable, firm about 2 cm mass superficial in the left axilla.  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  Local  excision has been recommended and accepted.  The nature of the procedure,  indications, risks of bleeding, infection were discussed and understood.  She is now brought to the operating room for this procedure.   DESCRIPTION OF OPERATION:  The patient brought to the operating room, placed  supine position on the operating table, and IV sedation was administered.  Left axilla was sterilely prepped and draped.  Local anesthesia was used to  infiltrate the skin and underlying soft tissue.  A transverse incision was  made in the skin crease in the axilla and dissection carried down into the  superficial subcutaneous tissue.  The mass was easily palpable superficially  and was sharply completely excised.  It measured about 2 cm grossly.  Hemostasis was obtained with electrocautery.  The skin was closed with  running subcuticular 5-0 Monocryl and Steri-Strips.  Sponge, needle, and  instrument counts were correct.  Dry sterile dressings  were applied and the  patient taken to recovery room in good condition.      BTH/MEDQ  D:  06/26/2004  T:  06/26/2004  Job:  [id number]

## 2010-08-19 ENCOUNTER — Encounter (HOSPITAL_BASED_OUTPATIENT_CLINIC_OR_DEPARTMENT_OTHER): Payer: Self-pay | Admitting: Emergency Medicine

## 2010-08-19 ENCOUNTER — Emergency Department (INDEPENDENT_AMBULATORY_CARE_PROVIDER_SITE_OTHER): Payer: No Typology Code available for payment source

## 2010-08-19 ENCOUNTER — Emergency Department (HOSPITAL_BASED_OUTPATIENT_CLINIC_OR_DEPARTMENT_OTHER)
Admission: EM | Admit: 2010-08-19 | Discharge: 2010-08-19 | Disposition: A | Discharge status: Home / Self Care | Payer: No Typology Code available for payment source | Attending: Emergency Medicine | Admitting: Emergency Medicine

## 2010-08-19 DIAGNOSIS — S060X9A Concussion with loss of consciousness of unspecified duration, initial encounter: Secondary | ICD-10-CM | POA: Insufficient documentation

## 2010-08-19 DIAGNOSIS — R51 Headache: Secondary | ICD-10-CM

## 2010-08-19 DIAGNOSIS — S060XAA Concussion with loss of consciousness status unknown, initial encounter: Secondary | ICD-10-CM | POA: Insufficient documentation

## 2010-08-19 MED ORDER — HYDROCODONE-ACETAMINOPHEN 5-325 MG PO TABS
1.0000 | ORAL_TABLET | Freq: Once | ORAL | Status: AC
  Administered 2010-08-19: 1 via ORAL
  Filled 2010-08-19: qty 1

## 2010-08-19 MED ORDER — HYDROCODONE-ACETAMINOPHEN 5-500 MG PO TABS: 1.0000 | ORAL_TABLET | Freq: Four times a day (QID) | ORAL | Status: DC | PRN
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
## 2010-08-21 ENCOUNTER — Ambulatory Visit (INDEPENDENT_AMBULATORY_CARE_PROVIDER_SITE_OTHER): Payer: Self-pay | Admitting: Family Medicine

## 2010-08-21 ENCOUNTER — Encounter: Payer: Self-pay | Admitting: Family Medicine

## 2010-08-21 VITALS — BP 130/90 | Temp 98.4°F | Wt 195.0 lb

## 2010-08-21 DIAGNOSIS — S060X9A Concussion with loss of consciousness of unspecified duration, initial encounter: Secondary | ICD-10-CM

## 2010-08-21 DIAGNOSIS — R51 Headache: Secondary | ICD-10-CM

## 2010-08-21 MED ORDER — NAPROXEN 500 MG PO TABS: 500.0000 mg | ORAL_TABLET | Freq: Two times a day (BID) | ORAL | Status: DC

## 2010-08-21 MED ORDER — CYCLOBENZAPRINE HCL 5 MG PO TABS: 5.0000 mg | ORAL_TABLET | Freq: Three times a day (TID) | ORAL | Status: AC | PRN
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
## 2010-08-21 NOTE — Patient Instructions (Signed)
Be in touch in 2 weeks if headache not resolving and sooner if any new symptoms or worsening headache.

## 2010-08-22 ENCOUNTER — Telehealth: Payer: Self-pay

## 2010-08-22 NOTE — Telephone Encounter (Signed)
Pt saw Dr. B yesterday and called to say that she feels better today but was still a little lightheaded this morning, so she did take the extra day off work to rest and needs a work note to return tomorrow

## 2010-08-22 NOTE — Telephone Encounter (Signed)
Note written, pt informed ready to pick-up [facility]

## 2010-08-27 ENCOUNTER — Telehealth: Payer: Self-pay | Admitting: *Deleted

## 2010-08-27 DIAGNOSIS — H9209 Otalgia, unspecified ear: Secondary | ICD-10-CM
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
## 2010-08-27 NOTE — Telephone Encounter (Signed)
OK to proceed with neurology appt-[facility] unless she has another preference.

## 2010-08-28 ENCOUNTER — Telehealth: Payer: Self-pay | Admitting: *Deleted
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
## 2010-10-01 ENCOUNTER — Ambulatory Visit (INDEPENDENT_AMBULATORY_CARE_PROVIDER_SITE_OTHER): Payer: 59 | Admitting: Internal Medicine

## 2010-10-01 ENCOUNTER — Encounter: Payer: Self-pay | Admitting: Internal Medicine

## 2010-10-01 VITALS — BP 130/94 | HR 76 | Temp 98.3°F | Ht 66.0 in | Wt 197.0 lb

## 2010-10-01 DIAGNOSIS — R51 Headache: Secondary | ICD-10-CM

## 2010-10-01 DIAGNOSIS — M722 Plantar fascial fibromatosis: Secondary | ICD-10-CM

## 2010-10-01 MED ORDER — OMEPRAZOLE 20 MG PO CPDR: 20.0000 mg | DELAYED_RELEASE_CAPSULE | Freq: Every day | ORAL | Status: DC
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
## 2010-10-01 NOTE — Patient Instructions (Signed)
HEEL pain: ice after walking, stretching, support shoes  Throat: trial omeprazole  Headache: i want you to see physical therapist
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
## 2010-10-01 NOTE — Assessment & Plan Note (Signed)
Discussed Ice, stretch and foot support

## 2010-10-02 ENCOUNTER — Telehealth: Payer: Self-pay | Admitting: Internal Medicine

## 2010-10-02 NOTE — Telephone Encounter (Signed)
Pt having FMLA papers faxed to Dr Cato Mulligan today. Pls be sure to complete and sign asap. Forms need to be sent back this week, before Friday.

## 2010-10-05 NOTE — Telephone Encounter (Signed)
Forms faxed, pt aware ready for p/u

## 2010-10-10 ENCOUNTER — Ambulatory Visit: Payer: 59 | Attending: Internal Medicine | Admitting: Physical Therapy

## 2010-10-10 DIAGNOSIS — IMO0001 Reserved for inherently not codable concepts without codable children: Secondary | ICD-10-CM | POA: Insufficient documentation

## 2010-10-10 DIAGNOSIS — M2569 Stiffness of other specified joint, not elsewhere classified: Secondary | ICD-10-CM | POA: Insufficient documentation

## 2010-10-10 DIAGNOSIS — M542 Cervicalgia: Secondary | ICD-10-CM | POA: Insufficient documentation

## 2010-10-16 ENCOUNTER — Encounter: Payer: 59 | Admitting: Rehabilitation

## 2010-10-18 ENCOUNTER — Ambulatory Visit: Payer: 59 | Admitting: Rehabilitation

## 2010-10-29 LAB — CBC
HCT: 38.5
Hemoglobin: 10.9 — ABNORMAL LOW
MCHC: 34.2
Platelets: 162
RBC: 3.46 — ABNORMAL LOW
RDW: 14
WBC: 12.6 — ABNORMAL HIGH
WBC: 15.1 — ABNORMAL HIGH

## 2010-10-29 LAB — COMPREHENSIVE METABOLIC PANEL
ALT: 12
AST: 15
Alkaline Phosphatase: 63
CO2: 26
Calcium: 8.1 — ABNORMAL LOW
GFR calc Af Amer: >60
GFR calc non Af Amer: >60
Glucose, Bld: 121 — ABNORMAL HIGH
Potassium: 3.5
Sodium: 137

## 2010-10-29 LAB — PREGNANCY, URINE: Preg Test, Ur: NEGATIVE

## 2010-11-06 LAB — GLUCOSE, CAPILLARY: Glucose-Capillary: 96

## 2010-12-31 ENCOUNTER — Ambulatory Visit (INDEPENDENT_AMBULATORY_CARE_PROVIDER_SITE_OTHER): Payer: 59 | Admitting: Internal Medicine

## 2010-12-31 DIAGNOSIS — Z23 Encounter for immunization: Secondary | ICD-10-CM

## 2011-01-28 ENCOUNTER — Encounter: Payer: Self-pay | Admitting: Internal Medicine

## 2011-01-28 ENCOUNTER — Ambulatory Visit (INDEPENDENT_AMBULATORY_CARE_PROVIDER_SITE_OTHER): Payer: 59 | Admitting: Internal Medicine

## 2011-01-28 VITALS — BP 120/80 | Temp 98.2°F | Wt 195.0 lb

## 2011-01-28 DIAGNOSIS — I10 Essential (primary) hypertension: Secondary | ICD-10-CM

## 2011-01-28 MED ORDER — FLUTICASONE PROPIONATE 50 MCG/ACT NA SUSP: 1.0000 | Freq: Every day | NASAL | Status: DC

## 2011-01-28 NOTE — Progress Notes (Signed)
  Subjective:    Patient ID: [name], female    DOB: April 20, 1960, 50 y.o.   MRN: [id number]  HPI  50 year old patient who presents with a two-month history of postnasal drip usually in the morning. This is occasionally associated with scanty amount of bleeding. Denies much in the way of sinus congestion sore throat. Denies any headache localized sinus or dental pain or fever. Rhinorrhea is not purulent. She has been prescribed nasal steroids in the past which she does not take consistently. He has a history of mild hypertension and dyslipidemia.    Review of Systems  Constitutional: Negative.   HENT: Positive for rhinorrhea and postnasal drip. Negative for hearing loss, congestion, sore throat, dental problem, sinus pressure and tinnitus.   Eyes: Negative for pain, discharge and visual disturbance.  Respiratory: Negative for cough and shortness of breath.   Cardiovascular: Negative for chest pain, palpitations and leg swelling.  Gastrointestinal: Negative for nausea, vomiting, abdominal pain, diarrhea, constipation, blood in stool and abdominal distention.  Genitourinary: Negative for dysuria, urgency, frequency, hematuria, flank pain, vaginal bleeding, vaginal discharge, difficulty urinating, vaginal pain and pelvic pain.  Musculoskeletal: Negative for joint swelling, arthralgias and gait problem.  Skin: Negative for rash.  Neurological: Negative for dizziness, syncope, speech difficulty, weakness, numbness and headaches.  Hematological: Negative for adenopathy.  Psychiatric/Behavioral: Negative for behavioral problems, dysphoric mood and agitation. The patient is not nervous/anxious.        Objective:   Physical Exam  Constitutional: She is oriented to person, place, and time. She appears well-developed and well-nourished.  HENT:  Head: Normocephalic.  Right Ear: External ear normal.  Left Ear: External ear normal.  Mouth/Throat: Oropharynx is clear and moist.       No sinus  tenderness  Eyes: Conjunctivae and EOM are normal. Pupils are equal, round, and reactive to light.  Neck: Normal range of motion. Neck supple. No thyromegaly present.  Cardiovascular: Normal rate, regular rhythm, normal heart sounds and intact distal pulses.   Pulmonary/Chest: Effort normal and breath sounds normal.  Abdominal: Soft. Bowel sounds are normal. She exhibits no mass. There is no tenderness.  Musculoskeletal: Normal range of motion.  Lymphadenopathy:    She has no cervical adenopathy.  Neurological: She is alert and oriented to person, place, and time.  Skin: Skin is warm and dry. No rash noted.  Psychiatric: She has a normal mood and affect. Her behavior is normal.          Assessment & Plan:   Mild URI. Probable component of allergic rhinitis Hypertension stable  We'll treat with Depo-Medrol and resume nasal steroids. Allegra-D also recommended  Return here when necessary

## 2011-01-28 NOTE — Patient Instructions (Signed)
  Omnaris or fluticasone  Daily  Allegra D twice daily

## 2011-02-12 ENCOUNTER — Other Ambulatory Visit (HOSPITAL_COMMUNITY): Payer: Self-pay | Admitting: Obstetrics and Gynecology

## 2011-02-12 DIAGNOSIS — Z1231 Encounter for screening mammogram for malignant neoplasm of breast: Secondary | ICD-10-CM

## 2011-03-12 ENCOUNTER — Ambulatory Visit (HOSPITAL_COMMUNITY)
Admission: RE | Admit: 2011-03-12 | Discharge: 2011-03-12 | Disposition: A | Discharge status: Home / Self Care | Payer: 59 | Source: Ambulatory Visit | Attending: Obstetrics and Gynecology | Admitting: Obstetrics and Gynecology

## 2011-03-12 DIAGNOSIS — Z1231 Encounter for screening mammogram for malignant neoplasm of breast: Secondary | ICD-10-CM | POA: Insufficient documentation

## 2011-04-25 ENCOUNTER — Other Ambulatory Visit: Payer: Self-pay | Admitting: Internal Medicine

## 2011-04-29 ENCOUNTER — Encounter: Payer: Self-pay | Admitting: *Deleted

## 2011-04-29 ENCOUNTER — Encounter: Payer: Self-pay | Admitting: Internal Medicine

## 2011-04-29 ENCOUNTER — Ambulatory Visit (INDEPENDENT_AMBULATORY_CARE_PROVIDER_SITE_OTHER): Payer: 59 | Admitting: Internal Medicine

## 2011-04-29 VITALS — BP 120/80 | Temp 98.1°F | Wt 196.0 lb

## 2011-04-29 DIAGNOSIS — R7309 Other abnormal glucose: Secondary | ICD-10-CM

## 2011-04-29 DIAGNOSIS — E162 Hypoglycemia, unspecified: Secondary | ICD-10-CM

## 2011-04-29 DIAGNOSIS — I10 Essential (primary) hypertension: Secondary | ICD-10-CM

## 2011-04-29 LAB — GLUCOSE, POCT (MANUAL RESULT ENTRY): POC Glucose: 69

## 2011-04-29 NOTE — Progress Notes (Signed)
  Subjective:    Patient ID: [name], female    DOB: 1960/07/19, 51 y.o.   MRN: [id number]  HPI  51 year old patient who has a history of impaired glucose tolerance as well as a family history of diabetes. Past couple weeks she has had a sensation of fatigue dry mouth and a foreign metallic taste in her mouth. She is noticed increased urinary frequency dry mouth and nocturia x2. She was concerned about worsening hyperglycemia. A random blood sugar this afternoon 69 she has a history of hypertension but has been off diuretic therapy.    Review of Systems  HENT: Negative for hearing loss, congestion, sore throat, rhinorrhea, dental problem, sinus pressure and tinnitus.   Eyes: Negative for pain, discharge and visual disturbance.  Respiratory: Negative for cough and shortness of breath.   Cardiovascular: Negative for chest pain, palpitations and leg swelling.  Gastrointestinal: Negative for nausea, vomiting, abdominal pain, diarrhea, constipation, blood in stool and abdominal distention.  Genitourinary: Positive for frequency. Negative for dysuria, urgency, hematuria, flank pain, vaginal bleeding, vaginal discharge, difficulty urinating, vaginal pain and pelvic pain.  Musculoskeletal: Negative for joint swelling, arthralgias and gait problem.  Skin: Negative for rash.  Neurological: Positive for weakness. Negative for dizziness, syncope, speech difficulty, numbness and headaches.  Hematological: Negative for adenopathy.  Psychiatric/Behavioral: Negative for behavioral problems, dysphoric mood and agitation. The patient is not nervous/anxious.        Objective:   Physical Exam  Constitutional: She is oriented to person, place, and time. She appears well-developed and well-nourished.  HENT:  Head: Normocephalic.  Right Ear: External ear normal.  Left Ear: External ear normal.  Mouth/Throat: Oropharynx is clear and moist.  Eyes: Conjunctivae and EOM are normal. Pupils are equal, round,  and reactive to light.  Neck: Normal range of motion. Neck supple. No thyromegaly present.  Cardiovascular: Normal rate, regular rhythm, normal heart sounds and intact distal pulses.   Pulmonary/Chest: Effort normal and breath sounds normal.  Abdominal: Soft. Bowel sounds are normal. She exhibits no mass. There is no tenderness.  Musculoskeletal: Normal range of motion.  Lymphadenopathy:    She has no cervical adenopathy.  Neurological: She is alert and oriented to person, place, and time.  Skin: Skin is warm and dry. No rash noted.  Psychiatric: She has a normal mood and affect. Her behavior is normal.          Assessment & Plan:   Hypertension stable History of impaired glucose tolerance. Weight loss exercise better diet all encouraged. A glucometer was dispensed for home use Recheck in 3 months

## 2011-04-29 NOTE — Patient Instructions (Signed)
Limit your sodium (Salt) intake    It is important that you exercise regularly, at least 20 minutes 3 to 4 times per week.  If you develop chest pain or shortness of breath seek  medical attention.  You need to lose weight.  Consider a lower calorie diet and regular exercise. 

## 2011-06-05 ENCOUNTER — Other Ambulatory Visit (INDEPENDENT_AMBULATORY_CARE_PROVIDER_SITE_OTHER): Payer: 59

## 2011-06-05 DIAGNOSIS — Z0000 Encounter for general adult medical examination without abnormal findings: Secondary | ICD-10-CM

## 2011-06-05 LAB — CBC WITH DIFFERENTIAL/PLATELET
Basophils Absolute: 0 10*3/uL (ref 0.0–0.1)
Basophils Relative: 0.4 % (ref 0.0–3.0)
Eosinophils Absolute: 0 10*3/uL (ref 0.0–0.7)
Hemoglobin: 13.6 g/dL (ref 12.0–15.0)
MCHC: 32.8 g/dL (ref 30.0–36.0)
MCV: 91 fl (ref 78.0–100.0)
Monocytes Absolute: 0.4 10*3/uL (ref 0.1–1.0)
Neutro Abs: 5.3 10*3/uL (ref 1.4–7.7)
RBC: 4.57 Mil/uL (ref 3.87–5.11)
RDW: 14.7 % — ABNORMAL HIGH (ref 11.5–14.6)

## 2011-06-05 LAB — POCT URINALYSIS DIPSTICK
Leukocytes, UA: NEGATIVE
Nitrite, UA: NEGATIVE
Protein, UA: NEGATIVE
Spec Grav, UA: 1.02
Urobilinogen, UA: 0.2

## 2011-06-05 LAB — BASIC METABOLIC PANEL
CO2: 23 mEq/L (ref 19–32)
Chloride: 105 mEq/L (ref 96–112)
Creatinine, Ser: 0.8 mg/dL (ref 0.4–1.2)
Glucose, Bld: 80 mg/dL (ref 70–99)
Sodium: 141 mEq/L (ref 135–145)

## 2011-06-05 LAB — HEPATIC FUNCTION PANEL
Albumin: 4.5 g/dL (ref 3.5–5.2)
Total Protein: 8.6 g/dL — ABNORMAL HIGH (ref 6.0–8.3)

## 2011-06-05 LAB — LIPID PANEL
Cholesterol: 225 mg/dL — ABNORMAL HIGH (ref 0–200)
Total CHOL/HDL Ratio: 3

## 2011-06-05 LAB — TSH: TSH: 1.1 u[IU]/mL (ref 0.35–5.50)

## 2011-06-12 ENCOUNTER — Ambulatory Visit (INDEPENDENT_AMBULATORY_CARE_PROVIDER_SITE_OTHER): Payer: 59 | Admitting: Internal Medicine

## 2011-06-12 ENCOUNTER — Encounter: Payer: Self-pay | Admitting: Internal Medicine

## 2011-06-12 VITALS — BP 135/90 | HR 68 | Temp 98.4°F | Ht 65.0 in | Wt 193.0 lb

## 2011-06-12 DIAGNOSIS — E663 Overweight: Secondary | ICD-10-CM

## 2011-06-12 DIAGNOSIS — Z0000 Encounter for general adult medical examination without abnormal findings: Secondary | ICD-10-CM

## 2011-06-12 NOTE — Progress Notes (Signed)
Patient ID: [name], female   DOB: 09/15/1960, 51 y.o.   MRN: [id number] cpx  Past Medical History  Diagnosis Date  . GERD (gastroesophageal reflux disease)   . Hypertension   . Diverticulosis   . Anal fissure   . IBS (irritable bowel syndrome)   . Hyperlipidemia   . Chest pain   . SOB (shortness of breath)     on occasion    History   Social History  . Marital Status: Single    Spouse Name: N/A    Number of Children: N/A  . Years of Education: N/A   Occupational History  .  [name]    [employer]   Social History Main Topics  . Smoking status: Former Smoker    Quit date: 04/02/1980  . Smokeless tobacco: Not on file  . Alcohol Use: Yes  . Drug Use: No  . Sexually Active: Not on file   Other Topics Concern  . Not on file   Social History Narrative  . No narrative on file    Past Surgical History  Procedure Date  . Rectal surgery     thromosis/fissure  . Cystectomy     neck  . Appendectomy   . Laparoscopy   . Breast surgery   . Tonsilectomy, adenoidectomy, bilateral myringotomy and tubes   . Abdominal hysterectomy     Family History  Problem Relation Age of Onset  . Diabetes Mother   . Hypertension Mother   . Colon cancer Father   . Cancer Father     Allergies  Allergen Reactions  . Amlodipine Besylate     REACTION: headache  . Codeine Sulfate Other (See Comments)    intolerance  . Lisinopril-Hydrochlorothiazide     REACTION: swelling lips/angioedema  . Meperidine Hcl Other (See Comments)    Passed out   . Vicodin (Hydrocodone-Acetaminophen)     GI upset    Current Outpatient Prescriptions on File Prior to Visit  Medication Sig Dispense Refill  . ciclesonide (OMNARIS) 50 MCG/ACT nasal spray 1 spray by Each Nare route daily.        Marland Kitchen estradiol (ESTRACE) 0.5 MG tablet Take 1 tablet by mouth daily.      Marland Kitchen LORazepam (ATIVAN) 0.5 MG tablet TAKE 1/2 TO 1 TABLET BY MOUTH EVERY DAY AS NEEDED FOR ANXIETY  10 tablet  0  .  metoprolol (LOPRESSOR) 50 MG tablet TAKE 1 TABLET BY MOUTH TWICE DAILY  180 tablet  2  . naproxen (NAPROSYN) 500 MG tablet Take 1 tablet (500 mg total) by mouth 2 (two) times daily with a meal.  40 tablet  0  . omeprazole (PRILOSEC) 20 MG capsule Take 1 capsule (20 mg total) by mouth daily.  30 capsule  1     patient denies chest pain, shortness of breath, orthopnea. Denies lower extremity edema, abdominal pain, change in appetite, change in bowel movements. Patient denies rashes, musculoskeletal complaints. No other specific complaints in a complete review of systems.   BP 164/100  Pulse 68  Temp(Src) 98.4 F (36.9 C) (Oral)  Ht 5\' 5"  (1.651 m)  Wt 193 lb (87.544 kg)  BMI 32.12 kg/m2  Well-developed well-nourished female in no acute distress. HEENT exam atraumatic, normocephalic, extraocular muscles are intact. Neck is supple. No jugular venous distention no thyromegaly. Chest clear to auscultation without increased work of breathing. Cardiac exam S1 and S2 are regular. Abdominal exam active bowel sounds, soft, nontender. Extremities no edema. Neurologic exam she is  alert without any motor sensory deficits. Gait is normal.   A/P: Well visit: health maintenance UTD  Discussed need for weight loss

## 2011-06-12 NOTE — Patient Instructions (Signed)
Monitor your blood pressure at home Call me if BP consistently above 140/90

## 2011-07-24 ENCOUNTER — Encounter: Payer: Self-pay | Admitting: Family

## 2011-07-24 ENCOUNTER — Ambulatory Visit (INDEPENDENT_AMBULATORY_CARE_PROVIDER_SITE_OTHER): Payer: 59 | Admitting: Family

## 2011-07-24 VITALS — BP 120/82 | Temp 98.5°F | Wt 190.0 lb

## 2011-07-24 DIAGNOSIS — A084 Viral intestinal infection, unspecified: Secondary | ICD-10-CM

## 2011-07-24 DIAGNOSIS — A088 Other specified intestinal infections: Secondary | ICD-10-CM

## 2011-07-24 DIAGNOSIS — R110 Nausea: Secondary | ICD-10-CM

## 2011-07-24 DIAGNOSIS — R197 Diarrhea, unspecified: Secondary | ICD-10-CM

## 2011-07-24 LAB — CBC WITH DIFFERENTIAL/PLATELET
Basophils Relative: 0.4 % (ref 0.0–3.0)
Eosinophils Relative: 0.4 % (ref 0.0–5.0)
Hemoglobin: 12.8 g/dL (ref 12.0–15.0)
MCV: 90.2 fl (ref 78.0–100.0)
Monocytes Absolute: 0.4 10*3/uL (ref 0.1–1.0)
Neutrophils Relative %: 54.2 % (ref 43.0–77.0)
RBC: 4.26 Mil/uL (ref 3.87–5.11)
WBC: 4.6 10*3/uL (ref 4.5–10.5)

## 2011-07-24 MED ORDER — PROMETHAZINE HCL 12.5 MG PO TABS: 12.5000 mg | ORAL_TABLET | Freq: Three times a day (TID) | ORAL | Status: DC | PRN

## 2011-07-24 NOTE — Patient Instructions (Signed)
Viral Gastroenteritis Viral gastroenteritis is also known as stomach flu. This condition affects the stomach and intestinal tract. It can cause sudden diarrhea and vomiting. The illness typically lasts 3 to 8 days. Most people develop an immune response that eventually gets rid of the virus. While this natural response develops, the virus can make you quite ill. CAUSES  Many different viruses can cause gastroenteritis, such as rotavirus or noroviruses. You can catch one of these viruses by consuming contaminated food or water. You may also catch a virus by sharing utensils or other personal items with an infected person or by touching a contaminated surface. SYMPTOMS  The most common symptoms are diarrhea and vomiting. These problems can cause a severe loss of body fluids (dehydration) and a body salt (electrolyte) imbalance. Other symptoms may include:  Fever.   Headache.   Fatigue.   Abdominal pain.  DIAGNOSIS  Your caregiver can usually diagnose viral gastroenteritis based on your symptoms and a physical exam. A stool sample may also be taken to test for the presence of viruses or other infections. TREATMENT  This illness typically goes away on its own. Treatments are aimed at rehydration. The most serious cases of viral gastroenteritis involve vomiting so severely that you are not able to keep fluids down. In these cases, fluids must be given through an intravenous line (IV). HOME CARE INSTRUCTIONS   Drink enough fluids to keep your urine clear or pale yellow. Drink small amounts of fluids frequently and increase the amounts as tolerated.   Ask your caregiver for specific rehydration instructions.   Avoid:   Foods high in sugar.   Alcohol.   Carbonated drinks.   Tobacco.   Juice.   Caffeine drinks.   Extremely hot or cold fluids.   Fatty, greasy foods.   Too much intake of anything at one time.   Dairy products until 24 to 48 hours after diarrhea stops.   You may  consume probiotics. Probiotics are active cultures of beneficial bacteria. They may lessen the amount and number of diarrheal stools in adults. Probiotics can be found in yogurt with active cultures and in supplements.   Wash your hands well to avoid spreading the virus.   Only take over-the-counter or prescription medicines for pain, discomfort, or fever as directed by your caregiver. Do not give aspirin to children. Antidiarrheal medicines are not recommended.   Ask your caregiver if you should continue to take your regular prescribed and over-the-counter medicines.   Keep all follow-up appointments as directed by your caregiver.  SEEK IMMEDIATE MEDICAL CARE IF:   You are unable to keep fluids down.   You do not urinate at least once every 6 to 8 hours.   You develop shortness of breath.   You notice blood in your stool or vomit. This may look like coffee grounds.   You have abdominal pain that increases or is concentrated in one small area (localized).   You have persistent vomiting or diarrhea.   You have a fever.   The patient is a child younger than 3 months, and he or she has a fever.   The patient is a child older than 3 months, and he or she has a fever and persistent symptoms.   The patient is a child older than 3 months, and he or she has a fever and symptoms suddenly get worse.   The patient is a baby, and he or she has no tears when crying.  MAKE SURE YOU:     Understand these instructions.   Will watch your condition.   Will get help right away if you are not doing well or get worse.  Document Released: 01/21/2005 Document Revised: 01/10/2011 Document Reviewed: 11/07/2010 [employer] Patient Information 2012 [employer]. 

## 2011-07-24 NOTE — Progress Notes (Signed)
Subjective:    Patient ID: [name], female    DOB: 1960-10-29, 51 y.o.   MRN: [id number]  HPI 51 year old African American female, patient of Dr. Cato Mulligan is in today with complaints of diarrhea, left lower abdominal pain, bloating, nausea x1 day. Patient reports eating fried pork chop, macaroni and cheese, and gr the night prior. Her son it is well was fine. She feels better today that she had yesterday. She's been consuming a clear liquid diet and tolerating it fine.   Review of Systems  Constitutional: Positive for fatigue.  HENT: Negative.   Respiratory: Negative.   Cardiovascular: Negative.   Gastrointestinal: Positive for abdominal pain.       Pain to the left lower quadrant.  Musculoskeletal: Negative.   Neurological: Negative.   Hematological: Negative.   Psychiatric/Behavioral: Negative.    Past Medical History  Diagnosis Date  . GERD (gastroesophageal reflux disease)   . Hypertension   . Diverticulosis   . Anal fissure   . IBS (irritable bowel syndrome)   . Hyperlipidemia   . Chest pain   . SOB (shortness of breath)     on occasion    History   Social History  . Marital Status: Single    Spouse Name: N/A    Number of Children: N/A  . Years of Education: N/A   Occupational History  .  [name]    [employer]   Social History Main Topics  . Smoking status: Former Smoker    Quit date: 04/02/1980  . Smokeless tobacco: Not on file  . Alcohol Use: Yes  . Drug Use: No  . Sexually Active: Not on file   Other Topics Concern  . Not on file   Social History Narrative  . No narrative on file    Past Surgical History  Procedure Date  . Rectal surgery     thromosis/fissure  . Appendectomy   . Laparoscopy   . Breast surgery   . Tonsilectomy, adenoidectomy, bilateral myringotomy and tubes   . Abdominal hysterectomy   . Neck surgery     cyst removed from neck    Family History  Problem Relation Age of Onset  . Diabetes Mother   .  Hypertension Mother   . Colon cancer Father   . Cancer Father     Allergies  Allergen Reactions  . Amlodipine Besylate     REACTION: headache  . Codeine Sulfate Other (See Comments)    intolerance  . Lisinopril-Hydrochlorothiazide     REACTION: swelling lips/angioedema  . Meperidine Hcl Other (See Comments)    Passed out   . Vicodin (Hydrocodone-Acetaminophen)     GI upset    Current Outpatient Prescriptions on File Prior to Visit  Medication Sig Dispense Refill  . estradiol (ESTRACE) 0.5 MG tablet Take 1 tablet by mouth daily.      . metoprolol (LOPRESSOR) 50 MG tablet TAKE 1 TABLET BY MOUTH TWICE DAILY  180 tablet  2  . ciclesonide (OMNARIS) 50 MCG/ACT nasal spray 1 spray by Each Nare route daily.        . promethazine (PHENERGAN) 12.5 MG tablet Take 1 tablet (12.5 mg total) by mouth every 8 (eight) hours as needed for nausea.  20 tablet  0    BP 120/82  Temp 98.5 F (36.9 C) (Oral)  Wt 190 lb (86.183 kg)chart    Objective:   Physical Exam  Constitutional: She is oriented to person, place, and time. She appears well-developed and  well-nourished.  HENT:  Right Ear: External ear normal.  Left Ear: External ear normal.  Nose: Nose normal.  Mouth/Throat: Oropharynx is clear and moist.  Cardiovascular: Normal rate, regular rhythm and normal heart sounds.   Pulmonary/Chest: Effort normal and breath sounds normal.  Abdominal: Soft. Bowel sounds are normal. There is tenderness.       Mild tenderness the left lower quadrant. No rebound tenderness or guarding.  Neurological: She is alert and oriented to person, place, and time.  Skin: Skin is warm and dry.  Psychiatric: She has a normal mood and affect.          Assessment & Plan:  Assessment: Viral gastroenteritis, nausea, diarrhea  Plan: Phenergan 12.5 mg one tablet every 8 hours when necessary nausea. Clear diet advance as tolerated. Rest. Drink plenty of fluids. Patient call the office to symptoms worsen or  persist. Recheck a schedule, appearing.

## 2011-08-06 ENCOUNTER — Encounter: Payer: Self-pay | Admitting: Cardiology

## 2011-09-06 ENCOUNTER — Encounter: Payer: Self-pay | Admitting: Internal Medicine

## 2011-09-06 ENCOUNTER — Ambulatory Visit (INDEPENDENT_AMBULATORY_CARE_PROVIDER_SITE_OTHER): Payer: 59 | Admitting: Internal Medicine

## 2011-09-06 VITALS — BP 130/90 | Temp 98.3°F | Wt 192.0 lb

## 2011-09-06 DIAGNOSIS — R51 Headache: Secondary | ICD-10-CM

## 2011-09-06 DIAGNOSIS — I10 Essential (primary) hypertension: Secondary | ICD-10-CM

## 2011-09-06 LAB — CBC WITH DIFFERENTIAL/PLATELET
Basophils Absolute: 0 10*3/uL (ref 0.0–0.1)
Eosinophils Relative: 0.8 % (ref 0.0–5.0)
Lymphocytes Relative: 33.6 % (ref 12.0–46.0)
Lymphs Abs: 2.8 10*3/uL (ref 0.7–4.0)
Monocytes Relative: 5.3 % (ref 3.0–12.0)
Neutrophils Relative %: 59.8 % (ref 43.0–77.0)
Platelets: 161 10*3/uL (ref 150.0–400.0)
RDW: 13.7 % (ref 11.5–14.6)
WBC: 8.2 10*3/uL (ref 4.5–10.5)

## 2011-09-06 LAB — BASIC METABOLIC PANEL
CO2: 26 mEq/L (ref 19–32)
Calcium: 9.6 mg/dL (ref 8.4–10.5)
Glucose, Bld: 77 mg/dL (ref 70–99)
Potassium: 3.6 mEq/L (ref 3.5–5.1)
Sodium: 139 mEq/L (ref 135–145)

## 2011-09-06 MED ORDER — OLMESARTAN MEDOXOMIL-HCTZ 40-12.5 MG PO TABS: 1.0000 | ORAL_TABLET | Freq: Every day | ORAL | Status: DC

## 2011-09-06 MED ORDER — TRAMADOL HCL 50 MG PO TABS: 50.0000 mg | ORAL_TABLET | Freq: Three times a day (TID) | ORAL | Status: AC | PRN

## 2011-09-06 NOTE — Patient Instructions (Signed)
Bupap  One every 6 hours Tramadol one every 6 hours   Please check your blood pressure on a regular basis.  If it is consistently greater than 150/90, please make an office appointment  Recheck 3 days
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
## 2011-09-09 ENCOUNTER — Encounter: Payer: Self-pay | Admitting: Internal Medicine

## 2011-09-09 ENCOUNTER — Ambulatory Visit (INDEPENDENT_AMBULATORY_CARE_PROVIDER_SITE_OTHER): Payer: 59 | Admitting: Internal Medicine

## 2011-09-09 VITALS — BP 140/90 | Temp 98.1°F

## 2011-09-09 DIAGNOSIS — R51 Headache: Secondary | ICD-10-CM

## 2011-09-09 DIAGNOSIS — I10 Essential (primary) hypertension: Secondary | ICD-10-CM

## 2011-09-09 MED ORDER — LOSARTAN POTASSIUM-HCTZ 100-25 MG PO TABS: 1.0000 | ORAL_TABLET | Freq: Every day | ORAL | Status: DC

## 2011-09-09 NOTE — Progress Notes (Signed)
Subjective:    Patient ID: [name], female    DOB: [birth year], 51 y.o.   MRN: [id number]  HPI  51 year old patient who is seen today for followup of her hypertension. She was seen last week with accelerated hypertension and headaches. She was placed on combination Benicar HCT last week. Her headaches are still present but quite mild. She has been monitoring home blood pressure readings with a wrist monitor with readings in the 130/80 range  Past Medical History  Diagnosis Date  . GERD (gastroesophageal reflux disease)   . Hypertension   . Diverticulosis   . Anal fissure   . IBS (irritable bowel syndrome)   . Hyperlipidemia   . Chest pain   . SOB (shortness of breath)     on occasion    History   Social History  . Marital Status: Single    Spouse Name: N/A    Number of Children: N/A  . Years of Education: N/A   Occupational History  .  [name]    [employer]   Social History Main Topics  . Smoking status: Former Smoker    Quit date: 04/02/1980  . Smokeless tobacco: Not on file  . Alcohol Use: Yes  . Drug Use: No  . Sexually Active: Not on file   Other Topics Concern  . Not on file   Social History Narrative  . No narrative on file    Past Surgical History  Procedure Date  . Rectal surgery     thromosis/fissure  . Appendectomy   . Laparoscopy   . Breast surgery   . Tonsilectomy, adenoidectomy, bilateral myringotomy and tubes   . Abdominal hysterectomy   . Neck surgery     cyst removed from neck    Family History  Problem Relation Age of Onset  . Diabetes Mother   . Hypertension Mother   . Colon cancer Father   . Cancer Father     Allergies  Allergen Reactions  . Amlodipine Besylate     REACTION: headache  . Codeine Sulfate Other (See Comments)    intolerance  . Lisinopril-Hydrochlorothiazide     REACTION: swelling lips/angioedema  . Meperidine Hcl Other (See Comments)    Passed out   . Vicodin (Hydrocodone-Acetaminophen)      GI upset    Current Outpatient Prescriptions on File Prior to Visit  Medication Sig Dispense Refill  . ciclesonide (OMNARIS) 50 MCG/ACT nasal spray 1 spray by Each Nare route daily.        Marland Kitchen estradiol (ESTRACE) 0.5 MG tablet Take 1 tablet by mouth daily.      . metoprolol (LOPRESSOR) 50 MG tablet TAKE 1 TABLET BY MOUTH TWICE DAILY  180 tablet  2  . olmesartan-hydrochlorothiazide (BENICAR HCT) 40-12.5 MG per tablet Take 1 tablet by mouth daily.      . promethazine (PHENERGAN) 12.5 MG tablet Take 12.5 mg by mouth every 8 (eight) hours as needed.      . traMADol (ULTRAM) 50 MG tablet Take 1 tablet (50 mg total) by mouth every 8 (eight) hours as needed for pain.  30 tablet  0    BP 140/90  Temp 98.1 F (36.7 C) (Oral)       Review of Systems     Objective:   Physical Exam  Constitutional: She appears well-developed and well-nourished. No distress.       Blood pressure 132/82 on the right 136/86 on the left  Assessment & Plan:   Hypertension improved Headaches improved  We'll switch to generic Hyzaar for cost considerations. We'll continue home blood pressure monitoring and reassess in 3 months or as needed

## 2011-09-09 NOTE — Patient Instructions (Signed)
Limit your sodium (Salt) intake  Please check your blood pressure on a regular basis.  If it is consistently greater than 150/90, please make an office appointment.     It is important that you exercise regularly, at least 20 minutes 3 to 4 times per week.  If you develop chest pain or shortness of breath seek  medical attention. 

## 2012-03-02 ENCOUNTER — Other Ambulatory Visit (HOSPITAL_COMMUNITY): Payer: Self-pay | Admitting: Obstetrics and Gynecology

## 2012-03-02 DIAGNOSIS — Z1231 Encounter for screening mammogram for malignant neoplasm of breast: Secondary | ICD-10-CM

## 2012-03-17 ENCOUNTER — Ambulatory Visit (HOSPITAL_COMMUNITY)
Admission: RE | Admit: 2012-03-17 | Discharge: 2012-03-17 | Disposition: A | Discharge status: Home / Self Care | Payer: BC Managed Care – PPO | Source: Ambulatory Visit | Attending: Obstetrics and Gynecology | Admitting: Obstetrics and Gynecology

## 2012-03-17 DIAGNOSIS — Z1231 Encounter for screening mammogram for malignant neoplasm of breast: Secondary | ICD-10-CM

## 2012-06-30 ENCOUNTER — Encounter: Payer: Self-pay | Admitting: Cardiology

## 2012-07-05 ENCOUNTER — Ambulatory Visit (INDEPENDENT_AMBULATORY_CARE_PROVIDER_SITE_OTHER): Payer: BC Managed Care – PPO | Admitting: Family Medicine

## 2012-07-05 ENCOUNTER — Other Ambulatory Visit: Payer: Self-pay | Admitting: Internal Medicine

## 2012-07-05 VITALS — BP 150/96 | HR 53 | Temp 98.4°F | Resp 16 | Ht 66.0 in | Wt 194.8 lb

## 2012-07-05 DIAGNOSIS — I10 Essential (primary) hypertension: Secondary | ICD-10-CM

## 2012-07-05 DIAGNOSIS — R42 Dizziness and giddiness: Secondary | ICD-10-CM

## 2012-07-05 DIAGNOSIS — R7309 Other abnormal glucose: Secondary | ICD-10-CM

## 2012-07-05 LAB — POCT URINALYSIS DIPSTICK
Nitrite, UA: NEGATIVE
Spec Grav, UA: 1.025
Urobilinogen, UA: 0.2
pH, UA: 6

## 2012-07-05 LAB — POCT CBC
Lymph, poc: 2.9 (ref 0.6–3.4)
MCHC: 31 g/dL — AB (ref 31.8–35.4)
MID (cbc): 0.5 (ref 0–0.9)
MPV: 10.8 fL (ref 0–99.8)
POC Granulocyte: 4.9 (ref 2–6.9)
POC LYMPH PERCENT: 34.8 %L (ref 10–50)
POC MID %: 5.8 %M (ref 0–12)
Platelet Count, POC: 180 10*3/uL (ref 142–424)
RDW, POC: 14 %

## 2012-07-05 LAB — GLUCOSE, POCT (MANUAL RESULT ENTRY): POC Glucose: 96 mg/dl (ref 70–99)

## 2012-07-05 LAB — COMPREHENSIVE METABOLIC PANEL
Alkaline Phosphatase: 114 U/L (ref 39–117)
Creat: 0.89 mg/dL (ref 0.50–1.10)
Glucose, Bld: 98 mg/dL (ref 70–99)
Sodium: 143 mEq/L (ref 135–145)
Total Bilirubin: 0.3 mg/dL (ref 0.3–1.2)
Total Protein: 7.3 g/dL (ref 6.0–8.3)

## 2012-07-05 LAB — POCT UA - MICROSCOPIC ONLY
Casts, Ur, LPF, POC: NEGATIVE
Crystals, Ur, HPF, POC: NEGATIVE

## 2012-07-05 LAB — TSH: TSH: 0.851 u[IU]/mL (ref 0.350–4.500)

## 2012-07-05 LAB — POCT SEDIMENTATION RATE: POCT SED RATE: 37 mm/hr — AB (ref 0–22)

## 2012-07-05 NOTE — Progress Notes (Signed)
Subjective:    Patient ID: [name], female    DOB: 07/28/1960, 52 y.o.   MRN: [id number] Chief Complaint  Patient presents with  . Dizziness    x 1 week      HPI  Feeling a little strange lately around her head, dizzy, feels like she is going to faint episodically for the past week - will resolve spontaneously.  Just a few seconds and happening mult times a day - a  Lot - several times today already.  Not connected to position change - yesterday happened while she was laying down to bed - so she was scared to go to sleep.  No vertigo, dizziness more like lightheadedness and presyncope. Tol po well, not nauseated. No changes in meds, taking only metoprolol bid for 3 yrs. No herbal products or supplements. Drinking lots of water - 4 bottles/d - nml for her.  Appetite nml. Urine is clear. No CP or SHoB. 2d ago her lower right arm was tingling and just went away spontaneously after rubbing it for a while.  Is having HAs - sev wks ago started w/ sharp pains on left temple - lasted for sev hrs then spontaneously resolved - since then has had minor HAs in different areas - most recently was diffuse -  and went away after sev hrs after taking med. No changes in vision, no congestion.  BP at home is usually around 135-140 - 2d ago it went up to 160-170 after stressful day at work.  Past Medical History  Diagnosis Date  . GERD (gastroesophageal reflux disease)   . Hypertension   . Diverticulosis   . Anal fissure   . IBS (irritable bowel syndrome)   . Hyperlipidemia   . Chest pain   . SOB (shortness of breath)     on occasion   Current Outpatient Prescriptions on File Prior to Visit  Medication Sig Dispense Refill  . metoprolol (LOPRESSOR) 50 MG tablet TAKE 1 TABLET BY MOUTH TWICE DAILY  180 tablet  2   No current facility-administered medications on file prior to visit.   Allergies  Allergen Reactions  . Amlodipine Besylate     REACTION: headache  . Codeine Sulfate Other (See  Comments)    intolerance  . Lisinopril-Hydrochlorothiazide     REACTION: swelling lips/angioedema  . Meperidine Hcl Other (See Comments)    Passed out   . Vicodin (Hydrocodone-Acetaminophen)     GI upset     Review of Systems  Constitutional: Negative for fever, chills, diaphoresis, activity change, appetite change and unexpected weight change.  HENT: Negative for congestion, facial swelling, rhinorrhea, sneezing, trouble swallowing, neck pain, neck stiffness and postnasal drip.   Eyes: Negative for photophobia, pain, discharge, redness and visual disturbance.  Respiratory: Negative for cough and shortness of breath.   Cardiovascular: Negative for chest pain, palpitations and leg swelling.  Gastrointestinal: Negative for nausea, vomiting, abdominal pain, diarrhea and constipation.  Endocrine: Negative for polydipsia, polyphagia and polyuria.  Genitourinary: Negative for dysuria, urgency, frequency, hematuria, decreased urine volume and difficulty urinating.  Skin: Negative for color change, pallor and rash.  Neurological: Positive for dizziness, light-headedness and headaches. Negative for tremors, syncope, facial asymmetry, weakness and numbness.  Psychiatric/Behavioral: Positive for sleep disturbance. Negative for decreased concentration. The patient is not nervous/anxious.       BP 150/96  Pulse 53  Temp(Src) 98.4 F (36.9 C) (Oral)  Resp 16  Ht 5\' 6"  (1.676 m)  Wt 194 lb 12.8 oz (88.361  kg)  BMI 31.46 kg/m2  SpO2 99% Objective:   Physical Exam  Constitutional: She is oriented to person, place, and time. She appears well-developed and well-nourished. She is active. No distress.  HENT:  Head: Normocephalic and atraumatic.  Right Ear: Tympanic membrane, external ear and ear canal normal.  Left Ear: Tympanic membrane, external ear and ear canal normal.  Nose: Nose normal. No mucosal edema or rhinorrhea. Right sinus exhibits no maxillary sinus tenderness. Left sinus exhibits  no maxillary sinus tenderness.  Mouth/Throat: Uvula is midline, oropharynx is clear and moist and mucous membranes are normal. No oropharyngeal exudate.  Eyes: Conjunctivae and EOM are normal. Pupils are equal, round, and reactive to light. Right eye exhibits no discharge. Left eye exhibits no discharge. No scleral icterus.  Neck: Normal range of motion. Neck supple. No thyromegaly present.  Cardiovascular: Normal rate, regular rhythm, normal heart sounds and intact distal pulses.   Pulmonary/Chest: Effort normal and breath sounds normal. No respiratory distress.  Abdominal: Soft. Bowel sounds are normal. She exhibits no distension and no mass. There is no tenderness. There is no rebound and no guarding.  Lymphadenopathy:    She has no cervical adenopathy.  Neurological: She is alert and oriented to person, place, and time. She has normal strength. She displays no atrophy and no tremor. No cranial nerve deficit or sensory deficit. She exhibits normal muscle tone. She displays a negative Romberg sign. Coordination and gait normal.  Skin: Skin is warm and dry. She is not diaphoretic. No erythema.  Psychiatric: She has a normal mood and affect. Her behavior is normal.   Results for orders placed in visit on 07/05/12  POCT CBC      Result Value Range   WBC 8.2  4.6 - 10.2 K/uL   Lymph, poc 2.9  0.6 - 3.4   POC LYMPH PERCENT 34.8  10 - 50 %L   MID (cbc) 0.5  0 - 0.9   POC MID % 5.8  0 - 12 %M   POC Granulocyte 4.9  2 - 6.9   Granulocyte percent 59.4  37 - 80 %G   RBC 4.34  4.04 - 5.48 M/uL   Hemoglobin 12.7  12.2 - 16.2 g/dL   HCT, POC 16.1  09.6 - 47.9 %   MCV 94.4  80 - 97 fL   MCH, POC 29.3  27 - 31.2 pg   MCHC 31.0 (*) 31.8 - 35.4 g/dL   RDW, POC 04.5     Platelet Count, POC 180  142 - 424 K/uL   MPV 10.8  0 - 99.8 fL  POCT URINALYSIS DIPSTICK      Result Value Range   Color, UA yellow     Clarity, UA clear     Glucose, UA neg     Bilirubin, UA neg     Ketones, UA trace     Spec  Grav, UA 1.025     Blood, UA trace     pH, UA 6.0     Protein, UA 30     Urobilinogen, UA 0.2     Nitrite, UA neg     Leukocytes, UA Negative    POCT UA - MICROSCOPIC ONLY      Result Value Range   WBC, Ur, HPF, POC 2-3     RBC, urine, microscopic 2-5     Bacteria, U Microscopic 2+     Mucus, UA pos     Epithelial cells, urine per micros 3-4  Crystals, Ur, HPF, POC neg     Casts, Ur, LPF, POC neg     Yeast, UA neg    GLUCOSE, POCT (MANUAL RESULT ENTRY)      Result Value Range   POC Glucose 96  70 - 99 mg/dl    Orthostatics negative   [facility] reading (PRIMARY) by  Dr. Clelia Croft. EKG: NSR, no ischemic changes. Assessment & Plan:  HYPERTENSION - Plan: POCT CBC, POCT SEDIMENTATION RATE, POCT urinalysis dipstick, POCT UA - Microscopic Only, TSH, Comprehensive metabolic panel, EKG 12-Lead  HYPERGLYCEMIA - Plan: POCT CBC, POCT SEDIMENTATION RATE, POCT urinalysis dipstick, POCT UA - Microscopic Only, TSH, Comprehensive metabolic panel, EKG 12-Lead, POCT glucose (manual entry)  Dizziness and giddiness - Plan: POCT CBC, POCT SEDIMENTATION RATE, POCT urinalysis dipstick, POCT UA - Microscopic Only, TSH, Comprehensive metabolic panel, EKG 12-Lead, POCT glucose (manual entry)  Unknown etiology

## 2012-07-06 ENCOUNTER — Encounter: Payer: Self-pay | Admitting: Family Medicine

## 2012-07-06 ENCOUNTER — Ambulatory Visit (INDEPENDENT_AMBULATORY_CARE_PROVIDER_SITE_OTHER): Payer: BC Managed Care – PPO | Admitting: Family Medicine

## 2012-07-06 VITALS — BP 144/100 | Temp 98.2°F | Wt 195.0 lb

## 2012-07-06 DIAGNOSIS — R51 Headache: Secondary | ICD-10-CM

## 2012-07-06 DIAGNOSIS — I10 Essential (primary) hypertension: Secondary | ICD-10-CM

## 2012-07-06 MED ORDER — HYDROCHLOROTHIAZIDE 25 MG PO TABS: 25.0000 mg | ORAL_TABLET | Freq: Every day | ORAL | Status: DC

## 2012-07-06 NOTE — Progress Notes (Signed)
Chief Complaint  Patient presents with  . post urgent care    dizziness, headaches    HPI:  52 yo F pt of Dr. Timoteo Gaul her for hypertension and mild dizziness, HAs occ on and off, BP has been has been a little up this week too - 140s/80s. Feels dizzy for a few seconds a few times per day. -she ran out of BP med metoprolol a few days ago - needs refill - only given 6 tabs yesterday - she took two then and 1 today -started: 1 week ago -denies: CP, SOB, palpitation, swelling, vision changes, dysuria, depression, anxiety -per review of chart, seen in urgent care clinic yesterday, had labs - CBC, MP, TSH ok, sed rate mildly high, urine abnormal - micro with bacteria, mucus, but epis - so likely contaminated, EKG normal per notes, orthostatics normal per notes. Told BP was elevated and told to follow up with PCP -per review of chart, appears has had issues with HAs, Hypertension and at times dizziness chronically - reports HAs on and off for years and unchanged - temporal in location -reports issues with HTN in the past, was on benicar in the past too - but it ran out -reports allergy to lisinopril  ROS: See pertinent positives and negatives per HPI.  Past Medical History  Diagnosis Date  . GERD (gastroesophageal reflux disease)   . Hypertension   . Diverticulosis   . Anal fissure   . IBS (irritable bowel syndrome)   . Hyperlipidemia   . Chest pain   . SOB (shortness of breath)     on occasion    Family History  Problem Relation Age of Onset  . Diabetes Mother   . Hypertension Mother   . Colon cancer Father   . Cancer Father     History   Social History  . Marital Status: Single    Spouse Name: N/A    Number of Children: N/A  . Years of Education: N/A   Occupational History  .  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 Smoking status: Former Smoker    Quit date: 04/02/1980  . Smokeless tobacco: None  . Alcohol Use: Yes  . Drug Use: No  .  Sexually Active: None   Other Topics Concern  . None   Social History Narrative  . None    Current outpatient prescriptions:metoprolol (LOPRESSOR) 50 MG tablet, TAKE 1 TABLET BY MOUTH TWICE DAILY, Disp: 180 tablet, Rfl: 3;  hydrochlorothiazide (HYDRODIURIL) 25 MG tablet, Take 1 tablet (25 mg total) by mouth daily., Disp: 30 tablet, Rfl: 1  EXAM:  Filed Vitals:   07/06/12 1334  BP: 144/100  Temp: 98.2 F (36.8 C)    Body mass index is 31.49 kg/(m^2).  GENERAL: vitals reviewed and listed above, alert, oriented, appears well hydrated and in no acute distress  HEENT: atraumatic, conjunttiva clear, no obvious abnormalities on inspection of external nose and ears, no abnormalities on inspection no torterous temp arteries or temp artery bruits, does have TTP in this region per her report  NECK: no obvious masses on inspection, no bruits  LUNGS: clear to auscultation bilaterally, no wheezes, rales or rhonchi, good air movement  CV: HRRR, no peripheral edema  MS: moves all extremities without noticeable abnormality  PSYCH: pleasant and cooperative, no obvious depression or anxiety  NEURO: PERRLA, CN II-XII grossly intact, finger to nose normal, dix-hallpike normal  ASSESSMENT AND PLAN:  Discussed the following assessment  and plan:  HYPERTENSION - Plan: hydrochlorothiazide (HYDRODIURIL) 25 MG tablet  Bilateral headaches - Plan: Ambulatory referral to ENT  -chronic issues with HTN, HAs, and reported dizziness in the past at visits. HAd extensive workup in [facility] yesterday and results reviewed. Did have mildly elevated ESR and has temporal HAs with some TTP in this region, doubt TA but will refer to ENT per her request for further evaluation. BP elevated yesterday and today mildly - had run out of BP and then doubled up on it yesterday. HAs unchanged and chronic and she reports occur when BP not controlled. Ensured had refills and discussed other meds for BP - she used to be on triple  therapy but had reaction to lisinopril. Added back HCTZ. Follow up with PCP. -Patient advised to return or notify a doctor immediately if symptoms worsen or persist or new concerns arise.  Patient Instructions  -keep taking metoprolol - take once daily  -start the hydrochlorothiazide - take once daily -As we discussed, we have prescribed a new medication for you at this appointment. We discussed the common and serious potential adverse effects of this medication and you can review these and more with the pharmacist when you pick up your medication.  Please follow the instructions for use carefully and notify us immediately if you have any problems taking this medication.  -We placed a referral for you as discussed to ear, nose and throat doctor for the temporal headahces. It usually takes about 1-2 weeks to process and schedule this referral. If you have not heard from [place] regarding this appointment in 2 weeks please contact our office.  -follow up with your doctor in 1-2 months or next available       [name].

## 2012-07-06 NOTE — Patient Instructions (Addendum)
-  keep taking metoprolol - take once daily  -start the hydrochlorothiazide - take once daily -As we discussed, we have prescribed a new medication for you at this appointment. We discussed the common and serious potential adverse effects of this medication and you can review these and more with the pharmacist when you pick up your medication.  Please follow the instructions for use carefully and notify us immediately if you have any problems taking this medication.  -We placed a referral for you as discussed to ear, nose and throat doctor for the temporal headahces. It usually takes about 1-2 weeks to process and schedule this referral. If you have not heard from [place] regarding this appointment in 2 weeks please contact our office.  -follow up with your doctor in 1-2 months or next available

## 2012-07-10 ENCOUNTER — Telehealth: Payer: Self-pay | Admitting: Internal Medicine

## 2012-07-10 DIAGNOSIS — G43909 Migraine, unspecified, not intractable, without status migrainosus: Secondary | ICD-10-CM

## 2012-07-10 NOTE — Telephone Encounter (Signed)
PT called and stated that she seen Dr. Selena Batten for migraines and was then sent to a ENT specialist. After seeing the ENT specialist today, she is now being sent to a neurologist. She is frustrated with the way this is going, and wanted to express so. She will also need a referral to a neurologist now, please assist.

## 2012-07-10 NOTE — Telephone Encounter (Signed)
Called pt and she is just frustrated that she was referred to someone who can't help her.  She states that she gets migraines a lot and she deals with it but she feels she is just getting the run around and nothing is getting solved.  The ENT dr told her he did not know why she was referred there cause he can't help her.  Placed referral to neurologist and told pt that Tim Lair will be calling her back on Monday for f/u on the appt with Dr Selena Batten.  Pt verbalized understanding and had no questions.

## 2012-07-29 ENCOUNTER — Encounter: Payer: Self-pay | Admitting: Neurology

## 2012-07-29 ENCOUNTER — Ambulatory Visit (INDEPENDENT_AMBULATORY_CARE_PROVIDER_SITE_OTHER): Payer: BC Managed Care – PPO | Admitting: Neurology

## 2012-07-29 VITALS — BP 146/88 | HR 58 | Ht 64.0 in | Wt 197.0 lb

## 2012-07-29 DIAGNOSIS — R51 Headache: Secondary | ICD-10-CM

## 2012-07-29 DIAGNOSIS — R42 Dizziness and giddiness: Secondary | ICD-10-CM

## 2012-07-29 NOTE — Progress Notes (Signed)
[facility]  PATIENT: [name] DOB: January 18, 1961  HISTORICAL  [name] is a 52 years old right-handed African American female, referred by her primary care physician for evaluation of transient dizziness, and headaches,  She had a previous history of hypertension, over the past 2 years, she had occasionally headaches, but has a frequent flareup in May 2014, she complains of frequent episode of dizziness spells, lightheaded, dizziness sensation, lasting for a few seconds, she complains of fainting sensation, as if she is going to pass out, but she had never lost consciousness, in addition, during those 3 weeks, she has frequent vortex area pressure headaches, with associated light noise sensitivity, lasting for a few hours, no visual loss,  She had daily symptoms for about 3 weeks, now has much improved.   She had a previous history of headaches, pounding, light noise sensitivity, with associated elevated blood pressure, metoprolol has been very helpful,   Recent laboratory evaluation showed normal CMP, CBC, TSH   REVIEW OF SYSTEMS: Full 14 system review of systems performed and notable only for headaches, dizziness  ALLERGIES: Allergies  Allergen Reactions  . Amlodipine Besylate     REACTION: headache  . Codeine Sulfate Other (See Comments)    intolerance  . Lisinopril-Hydrochlorothiazide     REACTION: swelling lips/angioedema  . Meperidine Hcl Other (See Comments)    Passed out   . Vicodin (Hydrocodone-Acetaminophen)     GI upset    HOME MEDICATIONS: Outpatient Prescriptions Prior to Visit  Medication Sig Dispense Refill  . hydrochlorothiazide (HYDRODIURIL) 25 MG tablet Take 1 tablet (25 mg total) by mouth daily.  30 tablet  1  . metoprolol (LOPRESSOR) 50 MG tablet TAKE 1 TABLET BY MOUTH TWICE DAILY  180 tablet  3   No facility-administered medications prior to visit.    PAST MEDICAL HISTORY: Past Medical History  Diagnosis Date  . GERD  (gastroesophageal reflux disease)   . Hypertension   . Diverticulosis   . Anal fissure   . IBS (irritable bowel syndrome)   . Hyperlipidemia   . Chest pain   . SOB (shortness of breath)     on occasion  . Dizziness   . HA (headache)     PAST SURGICAL HISTORY: Past Surgical History  Procedure Laterality Date  . Rectal surgery      thromosis/fissure  . Appendectomy    . Laparoscopy    . Breast surgery    . Tonsilectomy, adenoidectomy, bilateral myringotomy and tubes    . Abdominal hysterectomy    . Neck surgery      cyst removed from neck    FAMILY HISTORY: Family History  Problem Relation Age of Onset  . Diabetes Mother   . Hypertension Mother   . Colon cancer Father   . Cancer Father     SOCIAL HISTORY:  History   Social History  . Marital Status: Single    Spouse Name: N/A    Number of Children: 1  . Years of Education: college   Occupational History  .  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 [name]   Social History Main Topics  . Smoking status: Former Smoker    Types: Cigarettes    Quit date: 04/02/1968  . Smokeless tobacco: Never Used  . Alcohol Use: 0.6 oz/week    1 Glasses of wine per week     Comment: OCC  . Drug Use: No  . Sexually Active: Not  on file   Other Topics Concern  . Not on file   Social History Narrative   Patient is single and she works for [employer]. Patient has her associates degree. One child.   Caffeine- One iced every three days.   Right handed.     PHYSICAL EXAM  Filed Vitals:   07/29/12 0809  BP: 146/88  Pulse: 58  Height: 5\' 4"  (1.626 m)  Weight: 197 lb (89.359 kg)    Body mass index is 33.8 kg/(m^2).   Generalized: In no acute distress  Neck: Supple, no carotid bruits   Cardiac: Regular rate rhythm  Pulmonary: Clear to auscultation bilaterally  Musculoskeletal: No deformity  Neurological examination  Mentation: Alert oriented to time, place, history taking, and  causual conversation  Cranial nerve II-XII: Pupils were equal round reactive to light extraocular movements were full, visual field were full on confrontational test. facial sensation and strength were normal. hearing was intact to finger rubbing bilaterally. Uvula tongue midline.  head turning and shoulder shrug and were normal and symmetric.Tongue protrusion into cheek strength was normal.  Motor: normal tone, bulk and strength.  Sensory: Intact to fine touch, pinprick, preserved vibratory sensation, and proprioception at toes.  Coordination: Normal finger to nose, heel-to-shin bilaterally there was no truncal ataxia  Gait: Rising up from seated position without assistance, normal stance, without trunk ataxia, moderate stride, good arm swing, smooth turning, able to perform tiptoe, and heel walking without difficulty.   Romberg signs: Negative  Deep tendon reflexes: Brachioradialis 2/2, biceps 2/2, triceps 2/2, patellar 2/2, Achilles 2/2, plantar responses were flexor bilaterally.   DIAGNOSTIC DATA (LABS, IMAGING, TESTING) - I reviewed patient records, labs, notes, testing and imaging myself where available.  Lab Results  Component Value Date   WBC 8.2 07/05/2012   HGB 12.7 07/05/2012   HCT 41.0 07/05/2012   MCV 94.4 07/05/2012   PLT 161.0 09/06/2011      Component Value Date/Time   NA 143 07/05/2012 1321   K 3.4* 07/05/2012 1321   CL 102 07/05/2012 1321   CO2 26 07/05/2012 1321   GLUCOSE 98 07/05/2012 1321   BUN 14 07/05/2012 1321   CREATININE 0.89 07/05/2012 1321   CREATININE 0.8 09/06/2011 1441   CALCIUM 9.5 07/05/2012 1321   PROT 7.3 07/05/2012 1321   ALBUMIN 4.1 07/05/2012 1321   AST 14 07/05/2012 1321   ALT 16 07/05/2012 1321   ALKPHOS 114 07/05/2012 1321   BILITOT 0.3 07/05/2012 1321   GFRNONAA 112.36 09/27/2009 0837   GFRAA  Value: >60        The eGFR has been calculated using the MDRD equation. This calculation has not been validated in all clinical situations. eGFR's persistently <60 mL/min signify  possible Chronic Kidney Disease. 08/31/2008 0231   Lab Results  Component Value Date   CHOL 225* 06/05/2011   HDL 84.60 06/05/2011   LDLDIRECT 141.0 06/05/2011   TRIG 123.0 06/05/2011   CHOLHDL 3 06/05/2011   Lab Results  Component Value Date   HGBA1C 6.3 03/16/2010   No results found for this basename: VITAMINB12   Lab Results  Component Value Date   TSH 0.851 07/05/2012    ASSESSMENT AND PLAN   52 years old right-handed Caucasian female, with past medical history of migraine headaches, now presenting with transient dizziness, her headache and dizziness overall has improved, normal neurological examination,  Her symptoms could be due to  a migraine variant, she will return to clinic as needed  Kinsly Hild  Krista Blue, M.D. Ph.D.  [facility] [address], [place], [place] [address] [phone]

## 2012-09-04 ENCOUNTER — Telehealth: Payer: Self-pay | Admitting: Internal Medicine

## 2012-09-04 NOTE — Telephone Encounter (Signed)
Patient Information:  Caller Name: [name]  Phone: [phone]  Patient: [name], [name]  Gender: Female  DOB: Dec 24, 1960  Age: 52 Years  PCP: Birdie Sons (Adults only)  Pregnant: No  Office Follow Up:  Does the office need to follow up with this patient?: No  Instructions For The Office: N/A  RN Note:  Home care instruction and call back parameters reviewed. Understanding expressed. Patient will try home treatment and call back for questions, changes or concerns.  Symptoms  Reason For Call & Symptoms: Patient states recent [place] vacation and developed whleps on her left arm three days ago.  Location- from elbow to shoulder, no itching.  No contact per patient or bite.  Reviewed Health History In EMR: Yes  Reviewed Medications In EMR: Yes  Reviewed Allergies In EMR: Yes  Reviewed Surgeries / Procedures: Yes  Date of Onset of Symptoms: 09/01/2012  Treatments Tried: Alcohol, cream neosporin  Treatments Tried Worked: No OB / GYN:  LMP: Unknown  Guideline(s) Used:  Hives  Disposition Per Guideline:   Home Care  Reason For Disposition Reached:   Localized hives  Advice Given:  Antihistamine  One or two dosages of an antihistamine will accelerate the clearing of this type of hives.  Benadryl (diphenhydramine) is an antihistamine. The adult dose is 25-50 mg. If the hives are still present after 6 hours, repeat the Benadryl.  If Benadryl is not available, use any hay fever or cold medicine that contains an antihistamine. Examples of other antihistamines are chlorpheniramine (Chlortrimeton, Chlor-tripolon) and loratadine (Claritin, Alavert). Loratadine is a newer (second generation) antihistamine and it causes less sedation than diphenhydramine.  CAUTION: This type of medication may cause sleepiness. Do not drink alcohol, drive, or operate dangerous machinery while taking antihistamines. Do not take these medications if you have prostate problems.  Call Back If:  Severe hives  or severe itching persist more than 24 hours despite taking an antihistamine (e.g., Benadryl)  Hives last more than 1 week  You become worse.  Patient Will Follow Care Advice:  YES

## 2013-01-18 ENCOUNTER — Telehealth: Payer: Self-pay | Admitting: Internal Medicine

## 2013-01-18 NOTE — Telephone Encounter (Signed)
Pt has pain in left arm. Triage nurse advised it may be muscular / circulation issues. Pain from elbow to shoulder. Pain for 3 days. No appts this week. pls advise.

## 2013-01-19 NOTE — Telephone Encounter (Signed)
See if pt will be willing to see someone else at [facility] or [facility] or any office.

## 2013-01-19 NOTE — Telephone Encounter (Signed)
lmom for pt to sch an appt °

## 2013-02-26 ENCOUNTER — Other Ambulatory Visit: Payer: Self-pay | Admitting: Obstetrics and Gynecology

## 2013-04-06 ENCOUNTER — Other Ambulatory Visit (HOSPITAL_COMMUNITY): Payer: Self-pay | Admitting: Obstetrics and Gynecology

## 2013-04-06 DIAGNOSIS — Z1231 Encounter for screening mammogram for malignant neoplasm of breast: Secondary | ICD-10-CM

## 2013-04-13 ENCOUNTER — Ambulatory Visit (HOSPITAL_COMMUNITY)
Admission: RE | Admit: 2013-04-13 | Discharge: 2013-04-13 | Disposition: A | Discharge status: Home / Self Care | Payer: BC Managed Care – PPO | Source: Ambulatory Visit | Attending: Obstetrics and Gynecology | Admitting: Obstetrics and Gynecology

## 2013-04-13 DIAGNOSIS — Z1231 Encounter for screening mammogram for malignant neoplasm of breast: Secondary | ICD-10-CM

## 2013-05-28 ENCOUNTER — Emergency Department (HOSPITAL_COMMUNITY)
Admission: EM | Admit: 2013-05-28 | Discharge: 2013-05-28 | Disposition: A | Discharge status: Home / Self Care | Payer: BC Managed Care – PPO | Attending: Emergency Medicine | Admitting: Emergency Medicine

## 2013-05-28 ENCOUNTER — Emergency Department (HOSPITAL_COMMUNITY): Payer: BC Managed Care – PPO

## 2013-05-28 ENCOUNTER — Encounter (HOSPITAL_COMMUNITY): Payer: Self-pay | Admitting: Emergency Medicine

## 2013-05-28 DIAGNOSIS — Y9389 Activity, other specified: Secondary | ICD-10-CM | POA: Insufficient documentation

## 2013-05-28 DIAGNOSIS — Z8639 Personal history of other endocrine, nutritional and metabolic disease: Secondary | ICD-10-CM | POA: Insufficient documentation

## 2013-05-28 DIAGNOSIS — IMO0002 Reserved for concepts with insufficient information to code with codable children: Secondary | ICD-10-CM | POA: Insufficient documentation

## 2013-05-28 DIAGNOSIS — Z87891 Personal history of nicotine dependence: Secondary | ICD-10-CM | POA: Insufficient documentation

## 2013-05-28 DIAGNOSIS — Z8719 Personal history of other diseases of the digestive system: Secondary | ICD-10-CM | POA: Insufficient documentation

## 2013-05-28 DIAGNOSIS — Z79899 Other long term (current) drug therapy: Secondary | ICD-10-CM | POA: Insufficient documentation

## 2013-05-28 DIAGNOSIS — Z791 Long term (current) use of non-steroidal anti-inflammatories (NSAID): Secondary | ICD-10-CM | POA: Insufficient documentation

## 2013-05-28 DIAGNOSIS — S61409A Unspecified open wound of unspecified hand, initial encounter: Secondary | ICD-10-CM | POA: Insufficient documentation

## 2013-05-28 DIAGNOSIS — Z862 Personal history of diseases of the blood and blood-forming organs and certain disorders involving the immune mechanism: Secondary | ICD-10-CM | POA: Insufficient documentation

## 2013-05-28 DIAGNOSIS — Y9289 Other specified places as the place of occurrence of the external cause: Secondary | ICD-10-CM | POA: Insufficient documentation

## 2013-05-28 DIAGNOSIS — W540XXA Bitten by dog, initial encounter: Secondary | ICD-10-CM | POA: Insufficient documentation

## 2013-05-28 DIAGNOSIS — I10 Essential (primary) hypertension: Secondary | ICD-10-CM | POA: Insufficient documentation

## 2013-05-28 MED ORDER — ONDANSETRON 4 MG PO TBDP
4.0000 mg | ORAL_TABLET | Freq: Once | ORAL | Status: AC
  Administered 2013-05-28: 4 mg via ORAL
  Filled 2013-05-28: qty 1

## 2013-05-28 MED ORDER — AMOXICILLIN-POT CLAVULANATE 875-125 MG PO TABS: 1.0000 | ORAL_TABLET | Freq: Two times a day (BID) | ORAL | Status: DC

## 2013-05-28 MED ORDER — HYDROCODONE-ACETAMINOPHEN 5-325 MG PO TABS: 1.0000 | ORAL_TABLET | Freq: Once | ORAL | Status: DC

## 2013-05-28 MED ORDER — OXYCODONE-ACETAMINOPHEN 5-325 MG PO TABS
1.0000 | ORAL_TABLET | Freq: Once | ORAL | Status: AC
  Administered 2013-05-28: 1 via ORAL
  Filled 2013-05-28: qty 1

## 2013-05-28 MED ORDER — OXYCODONE-ACETAMINOPHEN 5-325 MG PO TABS: 1.0000 | ORAL_TABLET | Freq: Four times a day (QID) | ORAL | Status: DC | PRN

## 2013-05-28 NOTE — ED Provider Notes (Signed)
Medical screening examination/treatment/procedure(s) were performed by non-physician practitioner and as supervising physician I was immediately available for consultation/collaboration.   EKG Interpretation None        Osvaldo Shipper, MD 05/28/13 [phone]

## 2013-05-28 NOTE — ED Provider Notes (Signed)
CSN: [id number]     Arrival date & time 05/28/13  2059 History   First MD Initiated Contact with Patient 05/28/13 2115     No chief complaint on file.    (Consider location/radiation/quality/duration/timing/severity/associated sxs/prior Treatment) HPI  Patient presents to the emergency department for evaluation of her right hand after she got bit by her dog. The dog is a shepherd mix he has been acting more aggressive lately. She planned to [facility]. She admits that she was bit by the dog around the same time last year. She is having 7/10 pain. She can feel and move her fingers. She has not taken any medication for pain prior to arrival.  Past Medical History  Diagnosis Date  . GERD (gastroesophageal reflux disease)   . Hypertension   . Diverticulosis   . Anal fissure   . IBS (irritable bowel syndrome)   . Hyperlipidemia   . Chest pain   . SOB (shortness of breath)     on occasion  . Dizziness   . HA (headache)    Past Surgical History  Procedure Laterality Date  . Rectal surgery      thromosis/fissure  . Appendectomy    . Laparoscopy    . Breast surgery    . Tonsilectomy, adenoidectomy, bilateral myringotomy and tubes    . Abdominal hysterectomy    . Neck surgery      cyst removed from neck   Family History  Problem Relation Age of Onset  . Diabetes Mother   . Hypertension Mother   . Colon cancer Father   . Cancer Father    History  Substance Use Topics  . Smoking status: Former Smoker    Types: Cigarettes    Quit date: 04/02/1968  . Smokeless tobacco: Never Used  . Alcohol Use: 0.6 oz/week    1 Glasses of wine per week     Comment: OCC   OB History   Grav Para Term Preterm Abortions TAB SAB Ect Mult Living                 Review of Systems   Review of Systems  Gen: no weight loss, fevers, chills, night sweats  Eyes: no discharge or drainage, no occular pain or visual changes  Nose: no epistaxis or  rhinorrhea  Mouth: no dental pain, no sore throat  Neck: no neck pain  Lungs:No wheezing, coughing or hemoptysis CV: no chest pain, palpitations, dependent edema or orthopnea  Abd: no abdominal pain, nausea, vomiting, diarrhea GU: no dysuria or gross hematuria  MSK:  No muscle weakness or pain Neuro: no headache, no focal neurologic deficits  Skin: no rash + wounds Psyche: no complaints    Allergies  Amlodipine besylate; Codeine sulfate; Lisinopril-hydrochlorothiazide; Meperidine hcl; and Vicodin  Home Medications   Prior to Admission medications   Medication Sig Start Date End Date Taking? Authorizing Provider  metoprolol (LOPRESSOR) 50 MG tablet TAKE 1 TABLET BY MOUTH TWICE DAILY 07/05/12  Yes Lisabeth Pick, MD  naproxen (NAPROSYN) 250 MG tablet Take 500 mg by mouth 2 (two) times daily as needed (pain).   Yes Historical Provider, MD   BP 150/105  Pulse 79  Temp(Src) 98.4 F (36.9 C) (Oral)  Resp 22  Ht 5' 6.5" (1.689 m)  Wt 198 lb 6 oz (89.982 kg)  BMI 31.54 kg/m2  SpO2 98% Physical Exam  Nursing note and vitals reviewed. Constitutional: She appears well-developed and well-nourished. No distress.  HENT:  Head: Normocephalic and atraumatic.  Eyes: Pupils are equal, round, and reactive to light.  Neck: Normal range of motion. Neck supple.  Cardiovascular: Normal rate and regular rhythm.   Pulmonary/Chest: Effort normal.  Abdominal: Soft.  Musculoskeletal:       Hands: Two tiny abrasions to posterior hand with associated swelling.  Puncture wound  To the palm of hand that is approx 1 cm, It is still bleeding mildly. Extends into the subcutaneous fat.  CR < 2 sec to all 5 fingers. Physiologic ROM and strength to hand and fingers. Strong radial pulse  Neurological: She is alert.  Skin: Skin is warm and dry.    ED Course  Procedures (including critical care time) Labs Review Labs Reviewed - No data to display  Imaging Review Dg Hand Complete Right  05/28/2013    CLINICAL DATA:  Status post dog bite right hand.  EXAM: RIGHT HAND - COMPLETE 3+ VIEW  COMPARISON:  None.  FINDINGS: Imaged bones, joints and soft tissues appear normal.  IMPRESSION: Negative exam.   Electronically Signed   By: Inge Rise M.D.   On: 05/28/2013 21:47     EKG Interpretation None      MDM   Final diagnoses:  Dog bite    I discussed the risk of infection to bite wounds and the increased risk with suture repair. The patient reports concern of wound not healing since it is in the middle of her palm. She requests it be sutured. I will suture loosely.  LACERATION REPAIR Performed by: Linus Mako Authorized by: Linus Mako Consent: Verbal consent obtained. Risks and benefits: risks, benefits and alternatives were discussed Consent given by: patient Patient identity confirmed: provided demographic data Prepped and Draped in normal sterile fashion Wound explored  Laceration Location: palm of right hand   Laceration Length: 2 cm  No Foreign Bodies seen or palpated  Anesthesia: local infiltration  Local anesthetic: lidocaine 2% wo epinephrine  Anesthetic total: 2 ml  Irrigation method: syringe Amount of cleaning: standard  Skin closure: sutures  Number of sutures: 2 to approximate the edges loosely.  Technique: simple interrupted.  Patient tolerance: Patient tolerated the procedure well with no immediate complications.  Pt reports being UTD on Tetanus. Given 1 percocet in the ED to manage pain. WIll be started on Augmentin for prophylaxis to prevent infection.  53 y.o.[name]'s evaluation in the Emergency Department is complete. It has been determined that no acute conditions requiring further emergency intervention are present at this time. The patient/guardian have been advised of the diagnosis and plan. We have discussed signs and symptoms that warrant return to the ED, such as changes or worsening in symptoms.  Vital signs are  stable at discharge. Filed Vitals:   05/28/13 2106  BP: 150/105  Pulse: 79  Temp: 98.4 F (36.9 C)  Resp: 22    Patient/guardian has voiced understanding and agreed to follow-up with the PCP or specialist.     Linus Mako, PA-C 05/28/13 2213

## 2013-05-28 NOTE — ED Notes (Signed)
Pt reports being bitten by her own dog tonight @ 2015. Reports dog is UTD on vaccinations

## 2013-05-28 NOTE — Discharge Instructions (Signed)

## 2013-09-01 ENCOUNTER — Other Ambulatory Visit: Payer: Self-pay | Admitting: Internal Medicine

## 2013-10-07 ENCOUNTER — Encounter: Payer: Self-pay | Admitting: Podiatry

## 2013-10-07 ENCOUNTER — Ambulatory Visit (INDEPENDENT_AMBULATORY_CARE_PROVIDER_SITE_OTHER): Payer: BC Managed Care – PPO

## 2013-10-07 ENCOUNTER — Ambulatory Visit (INDEPENDENT_AMBULATORY_CARE_PROVIDER_SITE_OTHER): Payer: BC Managed Care – PPO | Admitting: Podiatry

## 2013-10-07 VITALS — BP 173/96 | HR 80 | Resp 15 | Ht 67.0 in | Wt 192.0 lb

## 2013-10-07 DIAGNOSIS — M79609 Pain in unspecified limb: Secondary | ICD-10-CM

## 2013-10-07 DIAGNOSIS — M79671 Pain in right foot: Secondary | ICD-10-CM

## 2013-10-07 DIAGNOSIS — M7750 Other enthesopathy of unspecified foot: Secondary | ICD-10-CM

## 2013-10-07 MED ORDER — TRIAMCINOLONE ACETONIDE 10 MG/ML IJ SUSP
10.0000 mg | Freq: Once | INTRAMUSCULAR | Status: AC
  Administered 2013-10-07: 10 mg

## 2013-10-07 MED ORDER — DICLOFENAC SODIUM 75 MG PO TBEC: 75.0000 mg | DELAYED_RELEASE_TABLET | Freq: Two times a day (BID) | ORAL | Status: DC

## 2013-10-07 NOTE — Progress Notes (Signed)
   Subjective:    Patient ID: [name], female    DOB: Dec 13, 1960, 53 y.o.   MRN: [id number]  HPI Comments: Pt complains of an aching pain in the right anterior/dorsal foot for 2 - 3 months.  Pt states it hurts more plantar flexing and is more comfortable in a high heeled shoe.  Pt states only treatment is soaking, and [place] [facility] alcohol therapy from [facility].  Foot Pain      Review of Systems  All other systems reviewed and are negative.      Objective:   Physical Exam        Assessment & Plan:

## 2013-10-07 NOTE — Progress Notes (Signed)
Subjective:     Patient ID: [name], female   DOB: 05/30/1960, 53 y.o.   MRN: [id number]  HPI   Review of Systems     Objective:   Physical Exam     Assessment:        Plan:

## 2013-10-07 NOTE — Progress Notes (Signed)
Subjective:     Patient ID: [name], female   DOB: February 09, 1960, 53 y.o.   MRN: [id number]  Foot Pain   patient points to the right foot stating it's been hurting on the outside and maybe the ankle and she is not sure as to what exactly has happened. Has been sore for about 3 months with no history in change of activity levels   Review of Systems  All other systems reviewed and are negative.      Objective:   Physical Exam  Nursing note and vitals reviewed. Constitutional: She is oriented to person, place, and time.  Cardiovascular: Intact distal pulses.   Musculoskeletal: Normal range of motion.  Neurological: She is oriented to person, place, and time.  Skin: Skin is warm.   neurovascular status intact with muscle strength adequate and range of motion of the subtalar and midtarsal joint within normal limits. Patient has discomfort mostly in the peroneal insertion base of the fifth metatarsal right with minimal discomfort in the dorsal ankle even though she seems to indicate this is where it hurts also when walking. Patient is noted to have good digital perfusion and normal arch height and is well oriented x3     Assessment:     Probable peroneal tendinitis right at the insertion    Plan:     H&P and x-rays reviewed. Today I went ahead and I injected the insertion of the peroneal tendon 3 mg Kenalog 5 mg Xylocaine and I applied fascially brace and placed on diclofenac 75 mg twice a day. Reappoint in 2 weeks

## 2013-10-21 ENCOUNTER — Ambulatory Visit: Payer: BC Managed Care – PPO | Admitting: Podiatry

## 2013-11-04 ENCOUNTER — Telehealth: Payer: Self-pay | Admitting: *Deleted

## 2013-11-04 ENCOUNTER — Ambulatory Visit (INDEPENDENT_AMBULATORY_CARE_PROVIDER_SITE_OTHER): Payer: BC Managed Care – PPO | Admitting: Podiatry

## 2013-11-04 ENCOUNTER — Encounter: Payer: Self-pay | Admitting: Podiatry

## 2013-11-04 VITALS — BP 127/75 | HR 71 | Resp 14

## 2013-11-04 DIAGNOSIS — M779 Enthesopathy, unspecified: Secondary | ICD-10-CM

## 2013-11-04 DIAGNOSIS — M7750 Other enthesopathy of unspecified foot: Secondary | ICD-10-CM

## 2013-11-04 MED ORDER — TRIAMCINOLONE ACETONIDE 10 MG/ML IJ SUSP
10.0000 mg | Freq: Once | INTRAMUSCULAR | Status: AC
  Administered 2013-11-04: 10 mg

## 2013-11-04 NOTE — Telephone Encounter (Signed)
I saw Dr. Paulla Dolly this morning.  I had a cortisone shot this morning I can't walk.  I can bend my leg but I can't walk on it.  It's excruciating pain.  I just had one 2 weeks ago.  I asked if it was swollen.  She stated she has a boot on and asked that I hold while she took the boot off.  She stated that it is swollen around the ankle.  It's not red or in-flammed.  It feels a little warm.  I told her that flare ups occur sometime nothing serious.  I told her I would check Dr. Paulla Dolly and see what he suggests.  She said, call me at work.  I called and informed her that Dr. Paulla Dolly said to ice, elevate as much as possible and take some Benadryl.  She stated, "I better go home then.  She said I think there is some ice here.  I will ice it until I go home and see how it does."

## 2013-11-04 NOTE — Progress Notes (Signed)
Subjective:     Patient ID: [name], female   DOB: 1960/09/23, 53 y.o.   MRN: [id number]  HPI patient states it feels quite a bit better on the outside of the right foot but it still hurts her around the ankle joint when she's walking   Review of Systems     Objective:   Physical Exam Neurovascular status intact with inflammation within the sinus tarsi right and improvement around the peroneal insertion right    Assessment:     Probable sinus tarsitis right with improve tendinitis right    Plan:     Inject the right sinus tarsi 3 mg Kenalog 5 mg Xylocaine and advised on continued brace usage and reappoint in 1 month to reevaluate and consider orthotics

## 2013-11-05 NOTE — Telephone Encounter (Signed)
I saw Dr. Paulla Dolly this morning and got an injection.  I'm in excruciating pain.  Give me a call.  Already addressed yesterday at 3:18pm

## 2013-11-10 ENCOUNTER — Telehealth: Payer: Self-pay | Admitting: Gastroenterology

## 2013-11-10 NOTE — Telephone Encounter (Signed)
Patient with diarrhea after each meal.  She will come in and see Dr. Fuller Plan on 11/15/13 3:30

## 2013-11-15 ENCOUNTER — Ambulatory Visit: Payer: BC Managed Care – PPO | Admitting: Internal Medicine

## 2013-11-15 ENCOUNTER — Ambulatory Visit (INDEPENDENT_AMBULATORY_CARE_PROVIDER_SITE_OTHER): Payer: BC Managed Care – PPO | Admitting: Gastroenterology

## 2013-11-15 ENCOUNTER — Encounter: Payer: Self-pay | Admitting: Gastroenterology

## 2013-11-15 ENCOUNTER — Other Ambulatory Visit (INDEPENDENT_AMBULATORY_CARE_PROVIDER_SITE_OTHER): Payer: BC Managed Care – PPO

## 2013-11-15 VITALS — BP 128/90 | HR 64 | Ht 66.5 in | Wt 193.6 lb

## 2013-11-15 DIAGNOSIS — R197 Diarrhea, unspecified: Secondary | ICD-10-CM

## 2013-11-15 DIAGNOSIS — Z800 Family history of malignant neoplasm of digestive organs: Secondary | ICD-10-CM

## 2013-11-15 DIAGNOSIS — K589 Irritable bowel syndrome without diarrhea: Secondary | ICD-10-CM

## 2013-11-15 LAB — CBC WITH DIFFERENTIAL/PLATELET
BASOS PCT: 0.6 % (ref 0.0–3.0)
Basophils Absolute: 0.1 10*3/uL (ref 0.0–0.1)
EOS PCT: 1.1 % (ref 0.0–5.0)
Eosinophils Absolute: 0.1 10*3/uL (ref 0.0–0.7)
HEMATOCRIT: 39.2 % (ref 36.0–46.0)
HEMOGLOBIN: 12.8 g/dL (ref 12.0–15.0)
Lymphocytes Relative: 34.5 % (ref 12.0–46.0)
Lymphs Abs: 3.2 10*3/uL (ref 0.7–4.0)
MCHC: 32.7 g/dL (ref 30.0–36.0)
MCV: 88.8 fl (ref 78.0–100.0)
Monocytes Absolute: 0.7 10*3/uL (ref 0.1–1.0)
Monocytes Relative: 7.9 % (ref 3.0–12.0)
NEUTROS PCT: 55.9 % (ref 43.0–77.0)
Neutro Abs: 5.3 10*3/uL (ref 1.4–7.7)
Platelets: 210 10*3/uL (ref 150.0–400.0)
RBC: 4.41 Mil/uL (ref 3.87–5.11)
RDW: 14.5 % (ref 11.5–15.5)
WBC: 9.4 10*3/uL (ref 4.0–10.5)

## 2013-11-15 LAB — BASIC METABOLIC PANEL
BUN: 14 mg/dL (ref 6–23)
CALCIUM: 9.4 mg/dL (ref 8.4–10.5)
CHLORIDE: 103 meq/L (ref 96–112)
CO2: 27 mEq/L (ref 19–32)
Creatinine, Ser: 0.9 mg/dL (ref 0.4–1.2)
GFR: 84.08 mL/min (ref >60.00)
Glucose, Bld: 95 mg/dL (ref 70–99)
Potassium: 3.6 mEq/L (ref 3.5–5.1)
Sodium: 138 mEq/L (ref 135–145)

## 2013-11-15 LAB — HEPATIC FUNCTION PANEL
ALT: 20 U/L (ref 0–35)
AST: 15 U/L (ref 0–37)
Albumin: 3.5 g/dL (ref 3.5–5.2)
Alkaline Phosphatase: 107 U/L (ref 39–117)
BILIRUBIN DIRECT: 0.1 mg/dL (ref 0.0–0.3)
BILIRUBIN TOTAL: 0.3 mg/dL (ref 0.2–1.2)
Total Protein: 8 g/dL (ref 6.0–8.3)

## 2013-11-15 LAB — TSH: TSH: 0.67 u[IU]/mL (ref 0.35–4.50)

## 2013-11-15 LAB — IGA: IgA: 222 mg/dL (ref 68–378)

## 2013-11-15 NOTE — Patient Instructions (Signed)
Your physician has requested that you go to the basement for lab work before leaving today  Please follow up with Dr. Fuller Plan on 12/01/2013 at 3:15pm.  You have a recall for a colonoscopy in 10/2014.  You will receive a letter ahead of time reminding you to call and schedule it.

## 2013-11-15 NOTE — Progress Notes (Signed)
    History of Present Illness: This is a 53 year old female who relates the onset of loose and urgent bowel movements for the past month. She has a history of irritable bowel syndrome but has not had active symptoms for several years. She has urgent loose unformed stools following meals for about one month. She denies any antibiotic usage, new medications, diet changes. She has occasional mild lower abdominal cramping associated with a bowel movement. Denies weight loss, abdominal pain, constipation, change in stool caliber, melena, hematochezia, nausea, vomiting, dysphagia, reflux symptoms, chest pain.  Current Medications, Allergies, Past Medical History, Past Surgical History, Family History and Social History were reviewed in [employer] record.  Physical Exam: General: Well developed , well nourished, no acute distress Head: Normocephalic and atraumatic Eyes:  sclerae anicteric, EOMI Ears: Normal auditory acuity Mouth: No deformity or lesions Lungs: Clear throughout to auscultation Heart: Regular rate and rhythm; no murmurs, rubs or bruits Abdomen: Soft, non tender and non distended. No masses, hepatosplenomegaly or hernias noted. Normal Bowel sounds Musculoskeletal: Symmetrical with no gross deformities  Pulses:  Normal pulses noted Extremities: No clubbing, cyanosis, edema or deformities noted Neurological: Alert oriented x 4, grossly nonfocal Psychological:  Alert and cooperative. Normal mood and affect  Assessment and Recommendations:   1. Family history of colon cancer, father. Routine surveillance colonoscopy recommended in September 2016.   2. Diarrhea. History of IBS that has been inactive for several years. Blood work including a tTG. GI pathogen panel. Consider FODMAP diet, Xifaxan or other antibiotic if above is negative. Imodium bid as needed for now. Consider the addition of an anti-spasmodic at return visit if her symptoms are not under better  control. REV in 2 weeks.

## 2013-11-16 ENCOUNTER — Other Ambulatory Visit: Payer: BC Managed Care – PPO

## 2013-11-16 DIAGNOSIS — R197 Diarrhea, unspecified: Secondary | ICD-10-CM

## 2013-11-16 LAB — GASTROINTESTINAL PATHOGEN PANEL PCR
C. DIFFICILE TOX A/B, PCR: NEGATIVE
CRYPTOSPORIDIUM, PCR: NEGATIVE
Campylobacter, PCR: NEGATIVE
E COLI (ETEC) LT/ST, PCR: NEGATIVE
E coli (STEC) stx1/stx2, PCR: NEGATIVE
E coli 0157, PCR: NEGATIVE
GIARDIA LAMBLIA, PCR: NEGATIVE
Norovirus, PCR: NEGATIVE
Rotavirus A, PCR: NEGATIVE
Salmonella, PCR: NEGATIVE
Shigella, PCR: NEGATIVE

## 2013-11-16 LAB — TISSUE TRANSGLUTAMINASE, IGA: TISSUE TRANSGLUTAMINASE AB, IGA: 7.7 U/mL (ref <20)

## 2013-11-25 ENCOUNTER — Ambulatory Visit: Payer: BC Managed Care – PPO | Admitting: Podiatry

## 2013-12-01 ENCOUNTER — Ambulatory Visit: Payer: BC Managed Care – PPO | Admitting: Gastroenterology

## 2014-01-04 ENCOUNTER — Ambulatory Visit (INDEPENDENT_AMBULATORY_CARE_PROVIDER_SITE_OTHER): Payer: BC Managed Care – PPO | Admitting: Internal Medicine

## 2014-01-04 VITALS — BP 146/90 | HR 61 | Temp 98.3°F | Resp 20 | Ht 65.25 in | Wt 194.4 lb

## 2014-01-04 DIAGNOSIS — M545 Low back pain, unspecified: Secondary | ICD-10-CM

## 2014-01-04 MED ORDER — CYCLOBENZAPRINE HCL 10 MG PO TABS
10.0000 mg | ORAL_TABLET | Freq: Every day | ORAL | Status: DC
Start: 2014-01-04 — End: 2014-10-05

## 2014-01-04 MED ORDER — MELOXICAM 15 MG PO TABS: 15.0000 mg | ORAL_TABLET | Freq: Every day | ORAL | Status: DC

## 2014-01-04 NOTE — Progress Notes (Addendum)
Subjective:  This chart was scribed for Brenda Koyanagi, MD by Ladene Artist, ED Scribe. The patient was seen in room 5. Patient's care was started at 8:11 PM.    Patient ID: [name], female    DOB: [birth year], 53 y.o.   MRN: [id number]  Chief Complaint  Patient presents with  . Back Pain    x 1 week   HPI HPI Comments: [name] is a 53 y.o. female, with a h/o HTN, hyperlipidemia, GERD, diverticulosis, IBS, who presents to the [facility] complaining of gradually worsening lower back pain onset 12/27/13. Pain is exacerbated with bending and getting out of a car or bed. Pain does not radiate into lower extremities. She denies heavy lifting. No h/o back pain. Pt also denies constipation, diarrhea, dysuria, fever, appetite change. Pt walks on the weekends for exercise. 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  Patient Active Problem List   Diagnosis Date Noted  . Dizziness   . HA (headache)   . HYPERLIPIDEMIA 10/04/2009  . HYPERGLYCEMIA--- no diagnosis of diabetes  06/28/2009  . HYPERTENSION 10/14/2006  . Headache 10/14/2006  . Overweight 02/04/2006    Past Medical History  Diagnosis Date  . GERD (gastroesophageal reflux disease)   . Hypertension   . Diverticulosis   . Anal fissure   . IBS (irritable bowel syndrome)   . Hyperlipidemia   . Chest pain   . SOB (shortness of breath)     on occasion  . Dizziness   . HA (headache)    Current Outpatient Prescriptions on File Prior to Visit  Medication Sig Dispense Refill  . metoprolol (LOPRESSOR) 50 MG tablet TAKE 1 TABLET BY MOUTH TWICE A DAY 180 tablet 1   No current facility-administered medications on file prior to visit.   Allergies  Allergen Reactions  . Amlodipine Besylate     REACTION: headache  . Codeine Sulfate Other (See Comments)    intolerance  . Lisinopril-Hydrochlorothiazide     REACTION: swelling lips/angioedema  . Meperidine Hcl Other (See Comments)      Passed out   . Vicodin [Hydrocodone-Acetaminophen]     GI upset   Review of Systems  Constitutional: Negative for fever and appetite change.  Gastrointestinal: Negative for diarrhea and constipation.  Genitourinary: Negative for dysuria.  Musculoskeletal: Positive for back pain.  Neurological: Negative for numbness.      Objective:   Physical Exam  Constitutional: She is oriented to person, place, and time. She appears well-developed and well-nourished. No distress.  HENT:  Head: Normocephalic and atraumatic.  Eyes: Conjunctivae and EOM are normal.  Neck: Neck supple.  Cardiovascular: Normal rate.   Pulmonary/Chest: Effort normal. No respiratory distress.  Musculoskeletal: Normal range of motion.  Tender to palpation over the L4/5 area, midline and just to the L. Straight leg raise is positive bilaterally at 90 degrees with midline pain but no radiation. Twisting and flexion are both uncomfortable.   Neurological: She is alert and oriented to person, place, and time. She has normal strength. No sensory deficit.  Reflex Scores:      Patellar reflexes are 2+ on the right side and 2+ on the left side. No distal, motor or sensory losses  Skin: Skin is warm and dry.  Psychiatric: She has a normal mood and affect. Her behavior is normal.  Nursing note and vitals reviewed.     Assessment & Plan:   I personally performed the services  described in this documentation, which was scribed in my presence. The recorded information has been reviewed and is accurate. Acute lumbosacral spasm with slight radicular symptoms Meds ordered this encounter  Medications  . cyclobenzaprine (FLEXERIL) 10 MG tablet    Sig: Take 1 tablet (10 mg total) by mouth at bedtime.    Dispense:  30 tablet    Refill:  0  . meloxicam (MOBIC) 15 MG tablet    Sig: Take 1 tablet (15 mg total) by mouth daily.    Dispense:  30 tablet    Refill:  0   follow-up in 2 weeks with imaging if she has not begun to  respond and referral to physical therapy if imaging normal She is given a booklet for posture changes and beginning exercises  I have completed the patient encounter in its entirety as documented by the scribe, with editing by me where necessary. Robert P. Laney Pastor, M.D.

## 2014-01-17 ENCOUNTER — Ambulatory Visit (INDEPENDENT_AMBULATORY_CARE_PROVIDER_SITE_OTHER): Payer: BC Managed Care – PPO | Admitting: Internal Medicine

## 2014-01-17 ENCOUNTER — Ambulatory Visit (INDEPENDENT_AMBULATORY_CARE_PROVIDER_SITE_OTHER): Payer: BC Managed Care – PPO

## 2014-01-17 VITALS — BP 169/115 | HR 76 | Temp 98.0°F | Resp 16 | Ht 66.5 in | Wt 196.6 lb

## 2014-01-17 DIAGNOSIS — M545 Low back pain, unspecified: Secondary | ICD-10-CM

## 2014-01-17 MED ORDER — TRAMADOL HCL 50 MG PO TABS: 50.0000 mg | ORAL_TABLET | Freq: Four times a day (QID) | ORAL | Status: DC | PRN

## 2014-01-17 NOTE — Progress Notes (Signed)
   Subjective:    Patient ID: [name], female    DOB: [birth year], 53 y.o.   MRN: [id number]  HPI note last visit. Her back pain has not responded to treatment with Flexeril and meloxicam with stretching and she is getting worse. The back pain is now present all the time. It interferes with activity and sleeping. They're still limited radicular symptoms. No numbness or weakness in the extremities. No genitourinary abnormalities described.  Probably medication is Lopressor for hypertension She is overweight. There is a history of hyperlipidemia but she is not on medication Past history of hyperglycemia but not felt to be diabetic  Review of Systems No fever chills or night sweats No weight loss No chest pain or shortness of breath No peripheral edema GI evaluation led to the diagnosis of irritable bowel syndrome relapse    Objective:   Physical Exam BP 169/115 mmHg  Pulse 76  Temp(Src) 98 F (36.7 C) (Oral)  Resp 16  Ht 5' 6.5" (1.689 m)  Wt 196 lb 9.6 oz (89.177 kg)  BMI 31.26 kg/m2  SpO2 100% She is in discomfort sitting on the table She is tender to palpation over the entire lumbar area but more prominent on the right than the left//straight leg raise to 90 produces some symptoms on the right but minimal and no radiation into the buttock or posterior thigh Deep tendon reflexes are stable and symmetrical No peripheral motor or sensory losses in the lower extremities Gait is stable     [facility] reading (PRIMARY) by  Dr.  Laney Pastor abnormal curvature of lumbar area but no signs of spondylolysis or disc space narrowing and no chronic degenerative changes   Assessment & Plan:   lumbosacral spasm with chronic muscle injury Hypertension uncontrolled secondary to pain Obesity   Add tramadol///continue Flexeril and meloxicam//add heat treatment at night  Refer to physical therapy Follow-up 2 weeks if not improved Weight loss would obviously help the situation

## 2014-04-28 ENCOUNTER — Other Ambulatory Visit: Payer: Self-pay | Admitting: Obstetrics and Gynecology

## 2014-04-28 DIAGNOSIS — R928 Other abnormal and inconclusive findings on diagnostic imaging of breast: Secondary | ICD-10-CM

## 2014-05-04 ENCOUNTER — Ambulatory Visit
Admission: RE | Admit: 2014-05-04 | Discharge: 2014-05-04 | Disposition: A | Discharge status: Home / Self Care | Payer: BLUE CROSS/BLUE SHIELD | Source: Ambulatory Visit | Attending: Obstetrics and Gynecology | Admitting: Obstetrics and Gynecology

## 2014-05-04 DIAGNOSIS — R928 Other abnormal and inconclusive findings on diagnostic imaging of breast: Secondary | ICD-10-CM

## 2014-08-11 ENCOUNTER — Encounter: Payer: Self-pay | Admitting: Gastroenterology

## 2014-08-18 ENCOUNTER — Encounter: Payer: Self-pay | Admitting: Gastroenterology

## 2014-10-05 ENCOUNTER — Ambulatory Visit (AMBULATORY_SURGERY_CENTER): Payer: Self-pay | Admitting: *Deleted

## 2014-10-05 VITALS — Ht 66.5 in | Wt 199.2 lb

## 2014-10-05 DIAGNOSIS — Z800 Family history of malignant neoplasm of digestive organs: Secondary | ICD-10-CM

## 2014-10-05 MED ORDER — SUPREP BOWEL PREP KIT 17.5-3.13-1.6 GM/177ML PO SOLN: 1.0000 | Freq: Once | ORAL | Status: DC

## 2014-10-05 NOTE — Progress Notes (Signed)
Patient denies any allergies to egg or soy products. Patient denies complications with anesthesia/sedation.  Patient denies oxygen use at home and denies diet medications. Emmi instructions for colonoscopy explained but patient denied.     

## 2014-10-19 ENCOUNTER — Ambulatory Visit (AMBULATORY_SURGERY_CENTER): Payer: BLUE CROSS/BLUE SHIELD | Admitting: Gastroenterology

## 2014-10-19 ENCOUNTER — Encounter: Payer: Self-pay | Admitting: Gastroenterology

## 2014-10-19 VITALS — BP 137/77 | HR 61 | Temp 98.0°F | Resp 14 | Ht 66.5 in | Wt 199.0 lb

## 2014-10-19 DIAGNOSIS — Z1211 Encounter for screening for malignant neoplasm of colon: Secondary | ICD-10-CM

## 2014-10-19 DIAGNOSIS — Z800 Family history of malignant neoplasm of digestive organs: Secondary | ICD-10-CM

## 2014-10-19 MED ORDER — SODIUM CHLORIDE 0.9 % IV SOLN: 500.0000 mL | INTRAVENOUS | Status: DC

## 2014-10-19 NOTE — Patient Instructions (Signed)
YOU HAD AN ENDOSCOPIC PROCEDURE TODAY AT THE [facility]:   Refer to the procedure report that was given to you for any specific questions about what was found during the examination.  If the procedure report does not answer your questions, please call your gastroenterologist to clarify.  If you requested that your care partner not be given the details of your procedure findings, then the procedure report has been included in a sealed envelope for you to review at your convenience later.  YOU SHOULD EXPECT: Some feelings of bloating in the abdomen. Passage of more gas than usual.  Walking can help get rid of the air that was put into your GI tract during the procedure and reduce the bloating. If you had a lower endoscopy (such as a colonoscopy or flexible sigmoidoscopy) you may notice spotting of blood in your stool or on the toilet paper. If you underwent a bowel prep for your procedure, you may not have a normal bowel movement for a few days.  Please Note:  You might notice some irritation and congestion in your nose or some drainage.  This is from the oxygen used during your procedure.  There is no need for concern and it should clear up in a day or so.  SYMPTOMS TO REPORT IMMEDIATELY:   Following lower endoscopy (colonoscopy or flexible sigmoidoscopy):  Excessive amounts of blood in the stool  Significant tenderness or worsening of abdominal pains  Swelling of the abdomen that is new, acute  Fever of 100F or higher   For urgent or emergent issues, a gastroenterologist can be reached at any hour by calling [phone].   DIET: Your first meal following the procedure should be a small meal and then it is ok to progress to your normal diet. Heavy or fried foods are harder to digest and may make you feel nauseous or bloated.  Likewise, meals heavy in dairy and vegetables can increase bloating.  Drink plenty of fluids but you should avoid alcoholic beverages for 24  hours.  ACTIVITY:  You should plan to take it easy for the rest of today and you should NOT DRIVE or use heavy machinery until tomorrow (because of the sedation medicines used during the test).    FOLLOW UP: Our staff will call the number listed on your records the next business day following your procedure to check on you and address any questions or concerns that you may have regarding the information given to you following your procedure. If we do not reach you, we will leave a message.  However, if you are feeling well and you are not experiencing any problems, there is no need to return our call.  We will assume that you have returned to your regular daily activities without incident.  If any biopsies were taken you will be contacted by phone or by letter within the next 1-3 weeks.  Please call us at [phone] if you have not heard about the biopsies in 3 weeks.    SIGNATURES/CONFIDENTIALITY: You and/or your care partner have signed paperwork which will be entered into your electronic medical record.  These signatures attest to the fact that that the information above on your After Visit Summary has been reviewed and is understood.  Full responsibility of the confidentiality of this discharge information lies with you and/or your care-partner.  Please review diverticulosis and high fiber diet handouts provided. 

## 2014-10-19 NOTE — Op Note (Signed)
[place]  [address] [place], [id number]   COLONOSCOPY PROCEDURE REPORT  PATIENT: [name], [name]  MR#: [id number] BIRTHDATE: September 07, 1960 , 59  yrs. old GENDER: female ENDOSCOPIST: Ladene Artist, MD, [facility] PROCEDURE DATE:  10/19/2014 PROCEDURE:   Colonoscopy, screening First Screening Colonoscopy - Avg.  risk and is 50 yrs.  old or older - No.  Prior Negative Screening - Now for repeat screening. Less than 10 yrs Prior Negative Screening - Now for repeat screening.  Above average risk  History of Adenoma - Now for follow-up colonoscopy & has been > or = to 3 yrs.  N/A  Polyps removed today? No Recommend repeat exam, <10 yrs? Yes high risk ASA CLASS:   Class II INDICATIONS:Screening for colonic neoplasia and FH Colon or Rectal Adenocarcinoma. MEDICATIONS: Monitored anesthesia care and Propofol 200 mg IV DESCRIPTION OF PROCEDURE:   After the risks benefits and alternatives of the procedure were thoroughly explained, informed consent was obtained.  The digital rectal exam revealed no abnormalities of the rectum.   The LB [id number]-H190 [id number]  endoscope was introduced through the anus and advanced to the cecum, which was identified by both the appendix and ileocecal valve. No adverse events experienced.   The quality of the prep was (Suprep was used) excellent.  The instrument was then slowly withdrawn as the colon was fully examined. Estimated blood loss is zero unless otherwise noted in this procedure report.    COLON FINDINGS: There was moderate diverticulosis noted in the sigmoid colon, descending colon, and transverse colon.   The examination was otherwise normal.  Retroflexed views revealed no abnormalities. The time to cecum = 3.2 Withdrawal time = 9.4   The scope was withdrawn and the procedure completed. COMPLICATIONS: There were no immediate complications.  ENDOSCOPIC IMPRESSION: 1.   Moderate diverticulosis in the sigmoid colon, descending  colon, and transverse colon 2.   The examination was otherwise normal  RECOMMENDATIONS: 1.  High fiber diet with liberal fluid intake. 2.  Repeat Colonoscopy in 5 years.  eSigned:  Ladene Artist, MD, [facility] 10/19/2014 10:49 AM

## 2014-10-19 NOTE — Progress Notes (Signed)
Report to PACU, RN, vss, BBS= Clear.  

## 2014-10-20 ENCOUNTER — Telehealth: Payer: Self-pay | Admitting: *Deleted

## 2014-10-20 NOTE — Telephone Encounter (Signed)
  Follow up Call-  Call back number 10/19/2014  Post procedure Call Back phone  # [phone]  Permission to leave phone message Yes     Patient questions:  Do you have a fever, pain , or abdominal swelling? No. Pain Score  0 *  Have you tolerated food without any problems? Yes.    Have you been able to return to your normal activities? Yes.    Do you have any questions about your discharge instructions: Diet   No. Medications  No. Follow up visit  No.  Do you have questions or concerns about your Care? No.  Actions: * If pain score is 4 or above: No action needed, pain <4.

## 2015-01-14 ENCOUNTER — Encounter: Payer: Self-pay | Admitting: Urgent Care

## 2015-01-14 ENCOUNTER — Ambulatory Visit (INDEPENDENT_AMBULATORY_CARE_PROVIDER_SITE_OTHER): Payer: BLUE CROSS/BLUE SHIELD | Admitting: Urgent Care

## 2015-01-14 VITALS — BP 128/80 | HR 82 | Temp 98.4°F | Resp 16 | Ht 65.0 in | Wt 200.0 lb

## 2015-01-14 DIAGNOSIS — R059 Cough, unspecified: Secondary | ICD-10-CM

## 2015-01-14 DIAGNOSIS — J029 Acute pharyngitis, unspecified: Secondary | ICD-10-CM | POA: Diagnosis not present

## 2015-01-14 DIAGNOSIS — R05 Cough: Secondary | ICD-10-CM | POA: Diagnosis not present

## 2015-01-14 DIAGNOSIS — J22 Unspecified acute lower respiratory infection: Secondary | ICD-10-CM

## 2015-01-14 DIAGNOSIS — J988 Other specified respiratory disorders: Secondary | ICD-10-CM | POA: Diagnosis not present

## 2015-01-14 MED ORDER — HYDROCODONE-HOMATROPINE 5-1.5 MG/5ML PO SYRP: 5.0000 mL | ORAL_SOLUTION | Freq: Every evening | ORAL | Status: DC | PRN

## 2015-01-14 MED ORDER — AZITHROMYCIN 250 MG PO TABS: ORAL_TABLET | ORAL | Status: DC

## 2015-01-14 MED ORDER — BENZONATATE 100 MG PO CAPS: 100.0000 mg | ORAL_CAPSULE | Freq: Three times a day (TID) | ORAL | Status: DC | PRN

## 2015-01-14 NOTE — Patient Instructions (Addendum)

## 2015-01-14 NOTE — Progress Notes (Signed)
    MRN: [id number] DOB: 1960/09/19  Subjective:   [name] is a 54 y.o. female presenting for chief complaint of Cough  Reports 3 week history of worsening productive cough, worse at night, makes it difficult for her to sleep. Has had coughing fits recently, elicits chest pain, wheezing. Has also had nasal congestion, chills. Has tried NyQuil and ibuprofen with minimal relief. Denies shob, chest tightness, n/v, abdominal pain, sinus pain, ear pain, ear drainage, tooth pain, itchy or red eyes. Denies history of seasonal allergies or asthma. Denies smoking cigarettes. Denies history of GERD, sour brash or acidic taste in her mouth.   [name] has a current medication list which includes the following prescription(s): metoprolol. Also is allergic to amlodipine besylate; codeine sulfate; lisinopril-hydrochlorothiazide; meperidine hcl; and vicodin.  [name]  has a past medical history of Hypertension; Diverticulosis; Anal fissure; IBS (irritable bowel syndrome); Chest pain; SOB (shortness of breath); Dizziness; HA (headache); GERD (gastroesophageal reflux disease); Hyperlipidemia; and Anxiety. Also  has past surgical history that includes Rectal surgery; Appendectomy; laparoscopy; Breast surgery; Tonsilectomy, adenoidectomy, bilateral myringotomy and tubes; Abdominal hysterectomy; Neck surgery; and Colonoscopy.  Objective:   Vitals: BP 128/80 mmHg  Pulse 82  Temp(Src) 98.4 F (36.9 C) (Oral)  Resp 16  Ht 5\' 5"  (1.651 m)  Wt 200 lb (90.719 kg)  BMI 33.28 kg/m2  SpO2 93%  Physical Exam  Constitutional: She is oriented to person, place, and time. She appears well-developed and well-nourished.  HENT:  TM's intact bilaterally, no effusions or erythema. Nasal turbinates pink and moist, nasal passages patent. No sinus tenderness. Oropharynx with slight post-nasal drainage, mucous membranes moist, dentition in good repair.  Eyes: Right eye exhibits no discharge. Left eye exhibits no discharge.  No scleral icterus.  Neck: Normal range of motion. Neck supple.  Cardiovascular: Normal rate, regular rhythm and intact distal pulses.  Exam reveals no gallop and no friction rub.   No murmur heard. Pulmonary/Chest: No respiratory distress. She has no wheezes. She has no rales.  Lymphadenopathy:    She has no cervical adenopathy.  Neurological: She is alert and oriented to person, place, and time.   Assessment and Plan :   1. Lower respiratory infection 2. Cough 3. Sore throat - Will cover for infectious process given progression of symptoms. Start Azithromycin, Hycodan and Tessalon for cough and sore throat. RTC in 1 week if no improvement, consider cxr at that point.  Jaynee Eagles, PA-C [facility] [phone] 01/14/2015 3:14 PM

## 2015-05-08 ENCOUNTER — Other Ambulatory Visit: Payer: Self-pay | Admitting: Obstetrics and Gynecology

## 2015-05-08 DIAGNOSIS — R928 Other abnormal and inconclusive findings on diagnostic imaging of breast: Secondary | ICD-10-CM

## 2015-05-15 ENCOUNTER — Ambulatory Visit
Admission: RE | Admit: 2015-05-15 | Discharge: 2015-05-15 | Disposition: A | Discharge status: Home / Self Care | Payer: Managed Care, Other (non HMO) | Source: Ambulatory Visit | Attending: Obstetrics and Gynecology | Admitting: Obstetrics and Gynecology

## 2015-05-15 DIAGNOSIS — R928 Other abnormal and inconclusive findings on diagnostic imaging of breast: Secondary | ICD-10-CM

## 2015-06-12 ENCOUNTER — Ambulatory Visit (INDEPENDENT_AMBULATORY_CARE_PROVIDER_SITE_OTHER): Payer: Managed Care, Other (non HMO) | Admitting: Physician Assistant

## 2015-06-12 ENCOUNTER — Ambulatory Visit (INDEPENDENT_AMBULATORY_CARE_PROVIDER_SITE_OTHER): Payer: Managed Care, Other (non HMO)

## 2015-06-12 VITALS — BP 126/82 | HR 75 | Temp 98.7°F | Resp 16 | Ht 66.0 in | Wt 197.0 lb

## 2015-06-12 DIAGNOSIS — R1030 Lower abdominal pain, unspecified: Secondary | ICD-10-CM | POA: Diagnosis not present

## 2015-06-12 NOTE — Patient Instructions (Signed)
Please try a few days of OTC Miralax. I recommend that you use it twice daily. If the symptoms continue, please return for re-evaluation.    IF you received an x-ray today, you will receive an invoice from [facility]. Please contact [facility] at [phone] with questions or concerns regarding your invoice.   IF you received labwork today, you will receive an invoice from [employer]. Please contact [facility] at [phone] with questions or concerns regarding your invoice.   Our billing staff will not be able to assist you with questions regarding bills from these companies.  You will be contacted with the lab results as soon as they are available. The fastest way to get your results is to activate your My Chart account. Instructions are located on the last page of this paperwork. If you have not heard from [place] regarding the results in 2 weeks, please contact this office.

## 2015-06-12 NOTE — Progress Notes (Signed)
Patient ID: [name], female    DOB: Jun 21, 1960, 55 y.o.   MRN: [id number]  PCP: Thressa Sheller, MD  Subjective:   Chief Complaint  Patient presents with  . Abdominal Pain    x 3 days    HPI Presents for evaluation of abdominal pain x 3 days.  "Really sharp, like being stabbed." Comes on so severely that she may fall to the ground. Primarily on the RIGHT side, very low. One episode yesterday on the LEFT which was "excruciating, throbbing."  No triggers. Last 5-10 seconds (except the one on the LEFT yesterday, which lasted almost a minute). No alleviating factors, so she just tries to stay still.  No previous episodes like this. No recent changes in her eating or activities. No significant changes in BMs, but notes that sometimes she has had rapid gastric transit recently. No melena or hematochezia. No fever. A little bit of chills-a bit the past few days when she's felt cold. No nausea or vomiting, but has felt a little bit lightheaded. Increased urinary frequency. No dysuria, urinary urgency, hematuria. No atypical vaginal discharge.  Partial hysterectomy (ovaries remain) about 10 years ago.   Review of Systems As above.    Patient Active Problem List   Diagnosis Date Noted  . Dizziness   . HA (headache)   . HYPERLIPIDEMIA 10/04/2009  . HYPERGLYCEMIA 06/28/2009  . HYPERTENSION 10/14/2006  . Headache(784.0) 10/14/2006  . Overweight(278.02) 02/04/2006     Prior to Admission medications   Medication Sig Start Date End Date Taking? Authorizing Provider  LORazepam (ATIVAN) 0.5 MG tablet Take 0.5 mg by mouth every 8 (eight) hours.   Yes Historical Provider, MD     Allergies  Allergen Reactions  . Amlodipine Besylate     REACTION: headache  . Codeine Sulfate Other (See Comments)    intolerance  . Lisinopril-Hydrochlorothiazide     REACTION: swelling lips/angioedema  . Meperidine Hcl Other (See Comments)    Passed out   . Vicodin  [Hydrocodone-Acetaminophen]     GI upset       Objective:  Physical Exam  Constitutional: She is oriented to person, place, and time. She appears well-developed and well-nourished. She is active and cooperative. No distress.  BP 126/82 mmHg  Pulse 75  Temp(Src) 98.7 F (37.1 C)  Resp 16  Ht 5\' 6"  (1.676 m)  Wt 197 lb (89.359 kg)  BMI 31.81 kg/m2  SpO2 99%  HENT:  Head: Normocephalic and atraumatic.  Right Ear: Hearing normal.  Left Ear: Hearing normal.  Eyes: Conjunctivae are normal. No scleral icterus.  Neck: Normal range of motion. Neck supple. No thyromegaly present.  Cardiovascular: Normal rate, regular rhythm and normal heart sounds.   Pulses:      Radial pulses are 2+ on the right side, and 2+ on the left side.  Pulmonary/Chest: Effort normal and breath sounds normal.  Abdominal: Soft. Normal appearance and bowel sounds are normal. There is no hepatosplenomegaly. Tenderness: "uncomfortable" with deep palpation in the LLQ, otherwise non-tender, unable to reproduce her pain. There is no CVA tenderness.  Lymphadenopathy:       Head (right side): No tonsillar, no preauricular, no posterior auricular and no occipital adenopathy present.       Head (left side): No tonsillar, no preauricular, no posterior auricular and no occipital adenopathy present.    She has no cervical adenopathy.       Right: No supraclavicular adenopathy present.       Left: No supraclavicular adenopathy  present.  Neurological: She is alert and oriented to person, place, and time. No sensory deficit.  Skin: Skin is warm, dry and intact. No rash noted. No cyanosis or erythema. Nails show no clubbing.  Psychiatric: She has a normal mood and affect. Her speech is normal and behavior is normal.    Dg Abd Acute W/chest  06/12/2015  CLINICAL DATA:  55 year old female with abdominal pain EXAM: DG ABDOMEN ACUTE W/ 1V CHEST COMPARISON:  Abdominal radiograph dated 01/17/2014 and CT dated 07/21/2009 FINDINGS:  Single-view of the chest demonstrate clear lungs with sharp costophrenic angles. There is no pneumothorax. The cardiac silhouette is within normal limits. No acute osseous pathology identified. Moderate stool noted within the proximal colon. There is no evidence of bowel obstruction. No free air identified. There is no radiopaque calculi. Bilateral tubal ligation clips noted. There is mild lumbar scoliosis degenerative changes of the spine. No acute fracture. IMPRESSION: No acute intrathoracic, or abdominal findings. Electronically Signed   By: Anner Crete M.D.   On: 06/12/2015 19:17          Assessment & Plan:   1. Lower abdominal pain Unclear etiology. Likely colon spasm, possibly due to constipation. Hydrate. Consider OTC Miralax. If persists, re-evaluate and consider other imaging, referral to GI. - DG Abd Acute W/Chest; Future   Fara Chute, PA-C Physician Assistant-Certified [facility]

## 2015-10-26 ENCOUNTER — Other Ambulatory Visit: Payer: Self-pay | Admitting: Internal Medicine

## 2015-10-26 DIAGNOSIS — R519 Headache, unspecified: Secondary | ICD-10-CM

## 2015-10-26 DIAGNOSIS — R51 Headache: Principal | ICD-10-CM

## 2015-10-31 ENCOUNTER — Ambulatory Visit
Admission: RE | Admit: 2015-10-31 | Discharge: 2015-10-31 | Disposition: A | Discharge status: Home / Self Care | Payer: Managed Care, Other (non HMO) | Source: Ambulatory Visit | Attending: Internal Medicine | Admitting: Internal Medicine

## 2015-10-31 DIAGNOSIS — R51 Headache: Principal | ICD-10-CM

## 2015-10-31 DIAGNOSIS — R519 Headache, unspecified: Secondary | ICD-10-CM

## 2016-01-07 ENCOUNTER — Emergency Department (HOSPITAL_COMMUNITY)
Admission: EM | Admit: 2016-01-07 | Discharge: 2016-01-07 | Disposition: A | Discharge status: Home / Self Care | Payer: Managed Care, Other (non HMO) | Attending: Emergency Medicine | Admitting: Emergency Medicine

## 2016-01-07 ENCOUNTER — Emergency Department (HOSPITAL_COMMUNITY): Payer: Managed Care, Other (non HMO)

## 2016-01-07 ENCOUNTER — Encounter (HOSPITAL_COMMUNITY): Payer: Self-pay | Admitting: Emergency Medicine

## 2016-01-07 DIAGNOSIS — S8002XA Contusion of left knee, initial encounter: Secondary | ICD-10-CM

## 2016-01-07 DIAGNOSIS — Z87891 Personal history of nicotine dependence: Secondary | ICD-10-CM | POA: Diagnosis not present

## 2016-01-07 DIAGNOSIS — W19XXXA Unspecified fall, initial encounter: Secondary | ICD-10-CM

## 2016-01-07 DIAGNOSIS — Y999 Unspecified external cause status: Secondary | ICD-10-CM | POA: Diagnosis not present

## 2016-01-07 DIAGNOSIS — Y939 Activity, unspecified: Secondary | ICD-10-CM | POA: Diagnosis not present

## 2016-01-07 DIAGNOSIS — I10 Essential (primary) hypertension: Secondary | ICD-10-CM | POA: Insufficient documentation

## 2016-01-07 DIAGNOSIS — S0083XA Contusion of other part of head, initial encounter: Secondary | ICD-10-CM | POA: Diagnosis not present

## 2016-01-07 DIAGNOSIS — Y92098 Other place in other non-institutional residence as the place of occurrence of the external cause: Secondary | ICD-10-CM | POA: Insufficient documentation

## 2016-01-07 DIAGNOSIS — S8992XA Unspecified injury of left lower leg, initial encounter: Secondary | ICD-10-CM | POA: Diagnosis present

## 2016-01-07 DIAGNOSIS — W01198A Fall on same level from slipping, tripping and stumbling with subsequent striking against other object, initial encounter: Secondary | ICD-10-CM | POA: Insufficient documentation

## 2016-01-07 DIAGNOSIS — Y92009 Unspecified place in unspecified non-institutional (private) residence as the place of occurrence of the external cause: Secondary | ICD-10-CM

## 2016-01-07 MED ORDER — CYCLOBENZAPRINE HCL 10 MG PO TABS
5.0000 mg | ORAL_TABLET | Freq: Once | ORAL | Status: AC
  Administered 2016-01-07: 5 mg via ORAL
  Filled 2016-01-07: qty 1

## 2016-01-07 MED ORDER — IBUPROFEN 800 MG PO TABS: 800.0000 mg | ORAL_TABLET | Freq: Three times a day (TID) | ORAL | 0 refills | Status: DC

## 2016-01-07 MED ORDER — IBUPROFEN 800 MG PO TABS
800.0000 mg | ORAL_TABLET | Freq: Once | ORAL | Status: AC
  Administered 2016-01-07: 800 mg via ORAL
  Filled 2016-01-07: qty 1

## 2016-01-07 MED ORDER — CYCLOBENZAPRINE HCL 10 MG PO TABS: 10.0000 mg | ORAL_TABLET | Freq: Two times a day (BID) | ORAL | 0 refills | Status: DC | PRN

## 2016-01-07 NOTE — ED Provider Notes (Signed)
[facility] DEPT Provider Note   CSN: [id number] Arrival date & time: 01/07/16  [id number]     History   Chief Complaint Chief Complaint  Patient presents with  . Fall    HPI [name] is a 55 y.o. female.  HPI   55 year old female with history of anxiety presenting for evaluation of fall. Patient report prior to coming to the ER she had on some slippery socks, and fell in the hallway of her house. States she fell onto the ground striking the left side of the chest and left knee against the ground and also striking her head against the ground. She denies any loss of consciousness and was able to get up on feet. She is unable to recall her last tetanus status however in her chart, she is up-to-date. Patient currently report 9 out of 10 sharp pain to her forehead, ribs and knee. She denies any numbness. No precipitating symptoms prior to the fall. She is not on any blood thinner medication. She denies any recent alcohol use.  Past Medical History:  Diagnosis Date  . Anal fissure   . Anxiety   . Chest pain    r/t anxiety/panic attacks  . Diverticulosis   . Dizziness    Hx in past - now resolved  . GERD (gastroesophageal reflux disease)    diet controlled, no med  . HA (headache)   . Hyperlipidemia    diet controlled, no med  . Hypertension   . IBS (irritable bowel syndrome)   . SOB (shortness of breath)    on occasion - resolved per patient    Patient Active Problem List   Diagnosis Date Noted  . Dizziness   . HA (headache)   . HYPERLIPIDEMIA 10/04/2009  . HYPERGLYCEMIA 06/28/2009  . HYPERTENSION 10/14/2006  . Headache(784.0) 10/14/2006  . Overweight(278.02) 02/04/2006    Past Surgical History:  Procedure Laterality Date  . ABDOMINAL HYSTERECTOMY     ovaries remain  . APPENDECTOMY    . BREAST SURGERY    . COLONOSCOPY    . LAPAROSCOPY    . NECK SURGERY     cyst removed from neck  . RECTAL SURGERY     thromosis/fissure  . TONSILECTOMY, ADENOIDECTOMY,  BILATERAL MYRINGOTOMY AND TUBES      OB History    No data available       Home Medications    Prior to Admission medications   Medication Sig Start Date End Date Taking? Authorizing Provider  LORazepam (ATIVAN) 0.5 MG tablet Take 0.5 mg by mouth every 8 (eight) hours.    Historical Provider, MD    Family History Family History  Problem Relation Age of Onset  . Diabetes Mother   . Hypertension Mother   . Colon cancer Father   . Cancer Father   . Rectal cancer Neg Hx   . Stomach cancer Neg Hx   . Esophageal cancer Neg Hx     Social History Social History  Substance Use Topics  . Smoking status: Former Smoker    Types: Cigarettes    Quit date: 04/02/1968  . Smokeless tobacco: Never Used  . Alcohol use 1.8 oz/week    3 Glasses of wine per week     Comment: occasionally     Allergies   Amlodipine besylate; Codeine sulfate; Lisinopril-hydrochlorothiazide; Meperidine hcl; and Vicodin [hydrocodone-acetaminophen]   Review of Systems Review of Systems  All other systems reviewed and are negative.    Physical Exam Updated Vital Signs BP 149/100  Pulse 72   Temp 98.2 F (36.8 C) (Oral)   Resp 20   SpO2 100%   Physical Exam  Constitutional: She appears well-developed and well-nourished. No distress.  HENT:  Head: Atraumatic.  Mild tenderness to right orbital region. Small skin tear noted to the upper eyelid without any foreign objects or deep laceration. Extraocular movements is intact pupils equal round and reactive.  Eyes: Conjunctivae are normal.  Neck: Neck supple.  Cardiovascular: Normal rate and regular rhythm.   Pulmonary/Chest: She exhibits tenderness (Tenderness noted to left anterior ribs on palpation without crepitus or emphysema. No overlying skin changes noted.).  Abdominal: Soft. There is no tenderness.  Musculoskeletal: She exhibits tenderness (Left knee: Tenderness noted to the anterior knee overlying the patella region. Patella is located.  Normal knee flexion extension no gross deformity.).  Neurological: She is alert.  Skin: No rash noted.  Psychiatric: She has a normal mood and affect.  Nursing note and vitals reviewed.    ED Treatments / Results  Labs (all labs ordered are listed, but only abnormal results are displayed) Labs Reviewed - No data to display  EKG  EKG Interpretation None       Radiology Dg Ribs Unilateral W/chest Left  Result Date: 01/07/2016 CLINICAL DATA:  Fall at home tonight. Left rib and chest pain. Initial encounter. EXAM: LEFT RIBS AND CHEST - 3+ VIEW COMPARISON:  06/12/2015 FINDINGS: No fracture or other bone lesions are seen involving the ribs. There is no evidence of pneumothorax or pleural effusion. Mild bilateral lower lung scarring again noted. No evidence of pulmonary infiltrate or edema. Heart size and mediastinal contours are within normal limits. IMPRESSION: No acute findings. Electronically Signed   By: Earle Gell M.D.   On: 01/07/2016 21:02   Dg Knee Complete 4 Views Left  Result Date: 01/07/2016 CLINICAL DATA:  Fall at home tonight. Left anterior knee pain and tenderness. Initial encounter. EXAM: LEFT KNEE - COMPLETE 4+ VIEW COMPARISON:  None. FINDINGS: No evidence of fracture, dislocation, or joint effusion. No evidence of arthropathy or other focal bone abnormality. Soft tissues are unremarkable. IMPRESSION: Negative. Electronically Signed   By: Earle Gell M.D.   On: 01/07/2016 21:07    Procedures Procedures (including critical care time)  Medications Ordered in ED Medications  ibuprofen (ADVIL,MOTRIN) tablet 800 mg (not administered)  cyclobenzaprine (FLEXERIL) tablet 5 mg (not administered)     Initial Impression / Assessment and Plan / ED Course  I have reviewed the triage vital signs and the nursing notes.  Pertinent labs & imaging results that were available during my care of the patient were reviewed by me and considered in my medical decision making (see chart  for details).  Clinical Course     BP 149/100   Pulse 72   Temp 98.2 F (36.8 C) (Oral)   Resp 20   SpO2 100%    Final Clinical Impressions(s) / ED Diagnoses   Final diagnoses:  Fall in home, initial encounter  Contusion of face, initial encounter  Contusion of left knee, initial encounter    New Prescriptions New Prescriptions   CYCLOBENZAPRINE (FLEXERIL) 10 MG TABLET    Take 1 tablet (10 mg total) by mouth 2 (two) times daily as needed for muscle spasms.   IBUPROFEN (ADVIL,MOTRIN) 800 MG TABLET    Take 1 tablet (800 mg total) by mouth 3 (three) times daily.   8:22 PM Pt had a mechanical fall, injurying her forehead, L ribs and L knee. No LOC.  She is UTD with immunization.   9:33 PM Xray of L ribs and L knee without acute fx or dislocation.  RICE therapy discussed.  Ortho referral given as needed.    Domenic Moras, PA-C 01/07/16 [id number]    Carmin Muskrat, MD 01/07/16 [id number]

## 2016-01-07 NOTE — ED Triage Notes (Signed)
Pt states she had on some slippery socks and fell in the hallway at her house. She has a small lac on her R eyelid. Bleeding controlled. She also c/o L rib and L knee pain. She does not know when she had a tetanus shot last. Rates pain 9/10.

## 2016-01-09 ENCOUNTER — Ambulatory Visit (INDEPENDENT_AMBULATORY_CARE_PROVIDER_SITE_OTHER): Payer: Managed Care, Other (non HMO) | Admitting: Family Medicine

## 2016-01-09 VITALS — BP 130/80 | HR 83 | Temp 98.5°F | Resp 17 | Ht 66.5 in | Wt 195.0 lb

## 2016-01-09 DIAGNOSIS — W19XXXD Unspecified fall, subsequent encounter: Secondary | ICD-10-CM

## 2016-01-09 DIAGNOSIS — S20212D Contusion of left front wall of thorax, subsequent encounter: Secondary | ICD-10-CM

## 2016-01-09 DIAGNOSIS — S0003XD Contusion of scalp, subsequent encounter: Secondary | ICD-10-CM | POA: Diagnosis not present

## 2016-01-09 DIAGNOSIS — Y92009 Unspecified place in unspecified non-institutional (private) residence as the place of occurrence of the external cause: Secondary | ICD-10-CM

## 2016-01-09 DIAGNOSIS — Y92099 Unspecified place in other non-institutional residence as the place of occurrence of the external cause: Secondary | ICD-10-CM | POA: Diagnosis not present

## 2016-01-09 MED ORDER — LIDOCAINE 4 % EX CREA: 1.0000 "application " | TOPICAL_CREAM | CUTANEOUS | 0 refills | Status: DC | PRN

## 2016-01-09 NOTE — Progress Notes (Signed)
Chief Complaint  Patient presents with  . right sided face pain    post fall   . 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 Anal fissure   . Anxiety   . Chest pain    r/t anxiety/panic attacks  . Diverticulosis   . Dizziness    Hx in past - now resolved  . GERD (gastroesophageal reflux disease)    diet controlled, no med  . HA (headache)   . Hyperlipidemia    diet controlled, no med  . Hypertension   . IBS (irritable bowel syndrome)   . SOB (shortness of breath)    on occasion - resolved per patient    Current Outpatient Prescriptions  Medication Sig Dispense Refill  . ibuprofen (ADVIL,MOTRIN) 800 MG tablet Take 1 tablet (800 mg total) by mouth 3 (three) times daily. 21 tablet 0  . LORazepam (ATIVAN) 0.5 MG tablet Take 0.5 mg by mouth every 8 (eight) hours.    . cyclobenzaprine (FLEXERIL) 10 MG tablet Take 1 tablet (10 mg total) by mouth 2 (two) times daily as needed for muscle spasms. 20 tablet 0  . lidocaine (ASPERCREME W/LIDOCAINE) 4 % cream Apply 1 application topically as needed. 30 g 0   No current facility-administered medications for this visit.     Allergies:  Allergies  Allergen Reactions  . Amlodipine Besylate     REACTION: headache  . Codeine Sulfate Other (See Comments)    intolerance  . Lisinopril-Hydrochlorothiazide     REACTION: swelling lips/angioedema  . Meperidine Hcl Other (See Comments)    Passed out   . Vicodin [Hydrocodone-Acetaminophen]     GI upset    Past  Surgical History:  Procedure Laterality Date  . ABDOMINAL HYSTERECTOMY     ovaries remain  . APPENDECTOMY    . BREAST SURGERY    . COLONOSCOPY    . LAPAROSCOPY    . NECK SURGERY     cyst removed from neck  . RECTAL SURGERY     thromosis/fissure  . TONSILECTOMY, ADENOIDECTOMY, BILATERAL MYRINGOTOMY AND TUBES      Social History   Social History  . Marital status: Single    Spouse name: n/a  . Number of children: 1  . Years of education: college   Occupational History  .  Time Ste[employer]  . SALES COACH [employer]   Social History Main Topics  . Smoking status: Former Smoker    Types: Cigarettes    Quit date: 04/02/1968  . Smokeless tobacco: Never Used  . Alcohol use 1.8 oz/week    3 Glasses of wine per week     Comment: occasionally  . Drug use: No  . Sexual activity: Yes    Birth control/ protection: Other-see comments     Comment: Hysterectomy   Other Topics Concern  . None   Social  History Narrative   Patient is single and she works for [employer]. Patient has her associates degree. One child (a son, born in 50).   Caffeine- One iced every three days.   Right handed.    ROS  Objective: Vitals:   01/09/16 0925  BP: 130/80  Pulse: 83  Resp: 17  Temp: 98.5 F (36.9 C)  TempSrc: Oral  SpO2: 97%  Weight: 195 lb (88.5 kg)  Height: 5' 6.5" (1.689 m)    Physical Exam  Constitutional: She is oriented to person, place, and time. She appears well-developed and well-nourished.  HENT:  Head: Normocephalic and atraumatic.  Right Ear: External ear normal.  Left Ear: External ear normal.  Nose: Nose normal.  Mouth/Throat: Oropharynx is clear and moist.  Scalp tender to palpation  Eyes: Conjunctivae and EOM are normal. Pupils are equal, round, and reactive to light. Right eye exhibits no discharge. Left eye exhibits no discharge. No scleral icterus.  Edema around the eye with small laceration above the right eye  Neck:  Normal range of motion. Neck supple.  Cardiovascular: Normal rate, regular rhythm, normal heart sounds and intact distal pulses.   No murmur heard. Pulmonary/Chest: Effort normal and breath sounds normal. No respiratory distress. She has no wheezes. She has no rales. She exhibits tenderness.  Tenderness to palpation of the left chest wall at the diaphragmatic border.  Musculoskeletal: Normal range of motion. She exhibits no edema.  Neurological: She is alert and oriented to person, place, and time.  Skin: Skin is warm. Capillary refill takes less than 2 seconds.  Psychiatric: She has a normal mood and affect. Her behavior is normal. Judgment and thought content normal.       Study Result   CLINICAL DATA:  Fall at home tonight. Left rib and chest pain. Initial encounter.  EXAM: LEFT RIBS AND CHEST - 3+ VIEW  COMPARISON:  06/12/2015  FINDINGS: No fracture or other bone lesions are seen involving the ribs. There is no evidence of pneumothorax or pleural effusion.  Mild bilateral lower lung scarring again noted. No evidence of pulmonary infiltrate or edema. Heart size and mediastinal contours are within normal limits.  IMPRESSION: No acute findings.   Electronically Signed   By: Earle Gell M.D.   On: 01/07/2016 21:02    Assessment and [place] was seen today for right sided face pain and chest pain.  Diagnoses and all orders for this visit:  Contusion of rib on left side, subsequent encounter Fall at home, subsequent encounter Provided work note giving today off for patient to rest  Continue with ice pack, nsaid, flexeril Add topical lidocaine Discussed that she should take deep breaths and cough to prevent atelectasis and pneumonia -     lidocaine (ASPERCREME W/LIDOCAINE) 4 % cream; Apply 1 application topically as needed.  Head contusion-  Continue flexeril and nsaid   Tashonda Pinkus A Nolon Rod

## 2016-01-09 NOTE — Patient Instructions (Addendum)
IF you received an x-ray today, you will receive an invoice from [facility]. Please contact [facility] at [phone] with questions or concerns regarding your invoice.   IF you received labwork today, you will receive an invoice from [facility]. Please contact [facility] at [phone] with questions or concerns regarding your invoice.   Our billing staff will not be able to assist you with questions regarding bills from these companies.  You will be contacted with the lab results as soon as they are available. The fastest way to get your results is to activate your My Chart account. Instructions are located on the last page of this paperwork. If you have not heard from [place] regarding the results in 2 weeks, please contact this office.    We recommend that you schedule a mammogram for breast cancer screening. Typically, you do not need a referral to do this. Please contact a local imaging center to schedule your mammogram.  [facility] - [phone]  *ask for the Radiology Department [facility] - [phone] or [phone]  [facility] - [phone] [place] [phone] [facility] - [phone]  *ask for the [facility] - [phone]  *ask for the Radiology Department [facility] - [phone]  *ask for the [place] - [phone]   Rib Contusion Introduction A rib contusion is a deep bruise on your rib area. Contusions are the result of a blunt trauma that causes bleeding and injury to the tissues under the skin. A rib contusion may involve bruising of the ribs and of the skin and muscles in the area. The skin overlying the contusion may turn blue, purple, or yellow. Minor injuries will give you a painless contusion, but more severe contusions may stay painful and  swollen for a few weeks. What are the causes? A contusion is usually caused by a blow, trauma, or direct force to an area of the body. This often occurs while playing contact sports. What are the signs or symptoms?  Swelling and redness of the injured area.  Discoloration of the injured area.  Tenderness and soreness of the injured area.  Pain with or without movement. How is this diagnosed? The diagnosis can be made by taking a medical history and performing a physical exam. An X-ray, CT scan, or MRI may be needed to determine if there were any associated injuries, such as broken bones (fractures) or internal injuries. How is this treated? Often, the best treatment for a rib contusion is rest. Icing or applying cold compresses to the injured area may help reduce swelling and inflammation. Deep breathing exercises may be recommended to reduce the risk of partial lung collapse and pneumonia. Over-the-counter or prescription medicines may also be recommended for pain control. Follow these instructions at home:  Apply ice to the injured area:  Put ice in a plastic bag.  Place a towel between your skin and the bag.  Leave the ice on for 20 minutes, 2-3 times per day.  Take medicines only as directed by your health care provider.  Rest the injured area. Avoid strenuous activity and any activities or movements that cause pain. Be careful during activities and avoid bumping the injured area.  Perform deep-breathing exercises as directed by your health care provider.  Do not lift anything that is heavier than 5 lb (2.3 kg) until your health care  provider approves.  Do not use any tobacco products, including cigarettes, chewing tobacco, or electronic cigarettes. If you need help quitting, ask your health care provider. Contact a health care provider if:  You have increased bruising or swelling.  You have pain that is not controlled with treatment.  You have a fever. Get help right  away if:  You have difficulty breathing or shortness of breath.  You develop a continual cough, or you cough up thick or bloody sputum.  You feel sick to your stomach (nauseous), you throw up (vomit), or you have abdominal pain. This information is not intended to replace advice given to you by your health care provider. Make sure you discuss any questions you have with your health care provider. Document Released: 10/16/2000 Document Revised: 06/29/2015 Document Reviewed: 11/02/2013  2017 Elsevier

## 2016-03-06 ENCOUNTER — Ambulatory Visit (INDEPENDENT_AMBULATORY_CARE_PROVIDER_SITE_OTHER): Payer: Managed Care, Other (non HMO) | Admitting: Physician Assistant

## 2016-03-06 VITALS — BP 122/60 | HR 83 | Temp 97.9°F | Ht 66.5 in | Wt 194.0 lb

## 2016-03-06 DIAGNOSIS — J0100 Acute maxillary sinusitis, unspecified: Secondary | ICD-10-CM | POA: Diagnosis not present

## 2016-03-06 MED ORDER — AMOXICILLIN-POT CLAVULANATE 875-125 MG PO TABS: 1.0000 | ORAL_TABLET | Freq: Two times a day (BID) | ORAL | 0 refills | Status: AC

## 2016-03-06 MED ORDER — IPRATROPIUM BROMIDE 0.03 % NA SOLN: 2.0000 | Freq: Two times a day (BID) | NASAL | 0 refills | Status: DC

## 2016-03-06 NOTE — Progress Notes (Signed)
Patient ID: [name], female    DOB: 1960-02-11, 56 y.o.   MRN: [id number]  PCP: Thressa Sheller, MD  Chief Complaint  Patient presents with  . Sore Throat    X 1 wekk  . Head Congestion    X 2 days    Subjective:   Presents for evaluation of sore throat and congestion.  Worsening x 4 days. Prior to that, x 2 weeks, has been clearing her throat all the time. Some hoarseness. Nasal/sinus congestion. Headache.  No body aches, no fever. No nausea or vomiting. No unexplained joint aches.  Mucinex and Alka-Seltzer Cold.  Received a flu vaccine this season.   Review of Systems As above.    Patient Active Problem List   Diagnosis Date Noted  . Dizziness   . HA (headache)   . HYPERLIPIDEMIA 10/04/2009  . HYPERGLYCEMIA 06/28/2009  . HYPERTENSION 10/14/2006  . Headache(784.0) 10/14/2006  . Overweight(278.02) 02/04/2006     Prior to Admission medications   Medication Sig Start Date End Date Taking? Authorizing Provider  cyclobenzaprine (FLEXERIL) 10 MG tablet Take 1 tablet (10 mg total) by mouth 2 (two) times daily as needed for muscle spasms. Patient not taking: Reported on 03/06/2016 01/07/16   Domenic Moras, PA-C     Allergies  Allergen Reactions  . Amlodipine Besylate     REACTION: headache  . Codeine Sulfate Other (See Comments)    intolerance  . Lisinopril-Hydrochlorothiazide     REACTION: swelling lips/angioedema  . Meperidine Hcl Other (See Comments)    Passed out   . Vicodin [Hydrocodone-Acetaminophen]     GI upset       Objective:  Physical Exam  Constitutional: She is oriented to person, place, and time. She appears well-developed and well-nourished. No distress.  BP 122/60   Pulse 83   Temp 97.9 F (36.6 C) (Oral)   Ht 5' 6.5" (1.689 m)   Wt 194 lb (88 kg)   SpO2 97%   BMI 30.84 kg/m    HENT:  Head: Normocephalic and atraumatic.  Right Ear: Hearing, tympanic membrane, external ear and ear canal normal.  Left Ear: Hearing,  tympanic membrane, external ear and ear canal normal.  Nose: Mucosal edema and rhinorrhea present.  No foreign bodies. Right sinus exhibits maxillary sinus tenderness. Right sinus exhibits no frontal sinus tenderness. Left sinus exhibits maxillary sinus tenderness. Left sinus exhibits no frontal sinus tenderness.  Mouth/Throat: Uvula is midline, oropharynx is clear and moist and mucous membranes are normal. No uvula swelling. No oropharyngeal exudate.  Eyes: Conjunctivae and EOM are normal. Pupils are equal, round, and reactive to light. Right eye exhibits no discharge. Left eye exhibits no discharge. No scleral icterus.  Neck: Trachea normal, normal range of motion and full passive range of motion without pain. Neck supple. No thyroid mass and no thyromegaly present.  Cardiovascular: Normal rate, regular rhythm and normal heart sounds.   Pulmonary/Chest: Effort normal and breath sounds normal.  Lymphadenopathy:       Head (right side): No submandibular, no tonsillar, no preauricular, no posterior auricular and no occipital adenopathy present.       Head (left side): No submandibular, no tonsillar, no preauricular and no occipital adenopathy present.    She has no cervical adenopathy.       Right: No supraclavicular adenopathy present.       Left: No supraclavicular adenopathy present.  Neurological: She is alert and oriented to person, place, and time. She has normal strength. No cranial  nerve deficit or sensory deficit.  Skin: Skin is warm, dry and intact. No rash noted.  Psychiatric: She has a normal mood and affect. Her speech is normal and behavior is normal.           Assessment & Plan:   1. Acute non-recurrent maxillary sinusitis Supportive care.  Anticipatory guidance.  RTC if symptoms worsen/persist. - amoxicillin-clavulanate (AUGMENTIN) 875-125 MG tablet; Take 1 tablet by mouth 2 (two) times daily.  Dispense: 20 tablet; Refill: 0 - ipratropium (ATROVENT) 0.03 % nasal spray;  Place 2 sprays into both nostrils 2 (two) times daily.  Dispense: 30 mL; Refill: 0   Fara Chute, PA-C Physician Assistant-Certified [facility] [place]

## 2016-03-06 NOTE — Patient Instructions (Addendum)
Continue the Mucinex. Get plenty of rest and drink at least 64 ounces of water daily.   IF you received an x-ray today, you will receive an invoice from [facility]. Please contact [facility] at [phone] with questions or concerns regarding your invoice.   IF you received labwork today, you will receive an invoice from [facility]. Please contact [facility] at [phone] with questions or concerns regarding your invoice.   Our billing staff will not be able to assist you with questions regarding bills from these companies.  You will be contacted with the lab results as soon as they are available. The fastest way to get your results is to activate your My Chart account. Instructions are located on the last page of this paperwork. If you have not heard from [place] regarding the results in 2 weeks, please contact this office.

## 2016-04-09 ENCOUNTER — Other Ambulatory Visit: Payer: Self-pay | Admitting: Obstetrics and Gynecology

## 2016-04-09 ENCOUNTER — Other Ambulatory Visit: Payer: Self-pay | Admitting: Internal Medicine

## 2016-04-09 DIAGNOSIS — Z1231 Encounter for screening mammogram for malignant neoplasm of breast: Secondary | ICD-10-CM

## 2016-05-06 ENCOUNTER — Ambulatory Visit: Payer: Managed Care, Other (non HMO)

## 2016-05-20 ENCOUNTER — Ambulatory Visit
Admission: RE | Admit: 2016-05-20 | Discharge: 2016-05-20 | Disposition: A | Discharge status: Home / Self Care | Payer: Managed Care, Other (non HMO) | Source: Ambulatory Visit | Attending: Internal Medicine | Admitting: Internal Medicine

## 2016-05-20 DIAGNOSIS — Z1231 Encounter for screening mammogram for malignant neoplasm of breast: Secondary | ICD-10-CM

## 2016-06-25 ENCOUNTER — Other Ambulatory Visit: Payer: Self-pay | Admitting: Obstetrics and Gynecology

## 2016-06-27 LAB — CYTOLOGY - PAP

## 2016-12-05 ENCOUNTER — Emergency Department (HOSPITAL_COMMUNITY): Payer: Managed Care, Other (non HMO)

## 2016-12-05 ENCOUNTER — Encounter (HOSPITAL_COMMUNITY): Payer: Self-pay | Admitting: Emergency Medicine

## 2016-12-05 ENCOUNTER — Emergency Department (HOSPITAL_COMMUNITY)
Admission: EM | Admit: 2016-12-05 | Discharge: 2016-12-05 | Disposition: A | Discharge status: Home / Self Care | Payer: Managed Care, Other (non HMO) | Attending: Emergency Medicine | Admitting: Emergency Medicine

## 2016-12-05 DIAGNOSIS — Z87891 Personal history of nicotine dependence: Secondary | ICD-10-CM | POA: Insufficient documentation

## 2016-12-05 DIAGNOSIS — R509 Fever, unspecified: Secondary | ICD-10-CM | POA: Diagnosis not present

## 2016-12-05 DIAGNOSIS — Z79899 Other long term (current) drug therapy: Secondary | ICD-10-CM | POA: Insufficient documentation

## 2016-12-05 DIAGNOSIS — R1031 Right lower quadrant pain: Secondary | ICD-10-CM | POA: Diagnosis not present

## 2016-12-05 DIAGNOSIS — I10 Essential (primary) hypertension: Secondary | ICD-10-CM | POA: Insufficient documentation

## 2016-12-05 DIAGNOSIS — R109 Unspecified abdominal pain: Secondary | ICD-10-CM

## 2016-12-05 LAB — COMPREHENSIVE METABOLIC PANEL
ALT: 16 U/L (ref 14–54)
ANION GAP: 11 (ref 5–15)
AST: 16 U/L (ref 15–41)
Albumin: 4.5 g/dL (ref 3.5–5.0)
Alkaline Phosphatase: 121 U/L (ref 38–126)
BILIRUBIN TOTAL: 0.7 mg/dL (ref 0.3–1.2)
BUN: 10 mg/dL (ref 6–20)
CALCIUM: 9.3 mg/dL (ref 8.9–10.3)
CO2: 25 mmol/L (ref 22–32)
Chloride: 106 mmol/L (ref 101–111)
Creatinine, Ser: 0.81 mg/dL (ref 0.44–1.00)
GFR calc Af Amer: >60 mL/min (ref >60)
Glucose, Bld: 100 mg/dL — ABNORMAL HIGH (ref 65–99)
POTASSIUM: 3.5 mmol/L (ref 3.5–5.1)
Sodium: 142 mmol/L (ref 135–145)
TOTAL PROTEIN: 8.4 g/dL — AB (ref 6.5–8.1)

## 2016-12-05 LAB — CBC
HEMATOCRIT: 38.3 % (ref 36.0–46.0)
HEMOGLOBIN: 12.9 g/dL (ref 12.0–15.0)
MCH: 29.6 pg (ref 26.0–34.0)
MCHC: 33.7 g/dL (ref 30.0–36.0)
MCV: 87.8 fL (ref 78.0–100.0)
Platelets: 202 10*3/uL (ref 150–400)
RBC: 4.36 MIL/uL (ref 3.87–5.11)
RDW: 13.9 % (ref 11.5–15.5)
WBC: 8.9 10*3/uL (ref 4.0–10.5)

## 2016-12-05 LAB — URINALYSIS, ROUTINE W REFLEX MICROSCOPIC
BILIRUBIN URINE: NEGATIVE
GLUCOSE, UA: NEGATIVE mg/dL
HGB URINE DIPSTICK: NEGATIVE
KETONES UR: NEGATIVE mg/dL
Leukocytes, UA: NEGATIVE
Nitrite: NEGATIVE
PH: 6 (ref 5.0–8.0)
Protein, ur: NEGATIVE mg/dL
SPECIFIC GRAVITY, URINE: 1.01 (ref 1.005–1.030)

## 2016-12-05 LAB — LIPASE, BLOOD: Lipase: 26 U/L (ref 11–51)

## 2016-12-05 MED ORDER — IOPAMIDOL (ISOVUE-300) INJECTION 61%
INTRAVENOUS | Status: AC
  Administered 2016-12-05: 100 mL
  Filled 2016-12-05: qty 100

## 2016-12-05 MED ORDER — ONDANSETRON HCL 4 MG/2ML IJ SOLN
4.0000 mg | Freq: Once | INTRAMUSCULAR | Status: AC
  Administered 2016-12-05: 4 mg via INTRAVENOUS
  Filled 2016-12-05: qty 2

## 2016-12-05 MED ORDER — MORPHINE SULFATE (PF) 2 MG/ML IV SOLN
2.0000 mg | Freq: Once | INTRAVENOUS | Status: AC
Start: 2016-12-05 — End: 2016-12-05
  Administered 2016-12-05: 2 mg via INTRAVENOUS
  Filled 2016-12-05: qty 1

## 2016-12-05 MED ORDER — SODIUM CHLORIDE 0.9 % IV BOLUS (SEPSIS)
500.0000 mL | Freq: Once | INTRAVENOUS | Status: AC
  Administered 2016-12-05: 500 mL via INTRAVENOUS

## 2016-12-05 MED ORDER — TRAMADOL HCL 50 MG PO TABS: 50.0000 mg | ORAL_TABLET | Freq: Four times a day (QID) | ORAL | 0 refills | Status: DC | PRN

## 2016-12-05 MED ORDER — KETOROLAC TROMETHAMINE 30 MG/ML IJ SOLN
15.0000 mg | Freq: Once | INTRAMUSCULAR | Status: AC
  Administered 2016-12-05: 15 mg via INTRAVENOUS
  Filled 2016-12-05: qty 1

## 2016-12-05 NOTE — ED Notes (Signed)
EDP MESSICK AWARE WITH VITALS AND STATES OKAY FOR DISCHARGE

## 2016-12-05 NOTE — ED Provider Notes (Signed)
[facility] DEPT Provider Note   CSN: [id number] Arrival date & time: 12/05/16  7829     History   Chief Complaint Chief Complaint  Patient presents with  . Abdominal Pain    HPI [name] is a 56 y.o. female.  56 year old female presents with complaint of right flank and right lower abdominal pain. She reports that symptoms started at least 2 weeks ago. She reports seeing her GYN 2 weeks ago and getting an ultrasound "check my ovaries" - per her report, the [place] was "fine." She also reports seeing an Urgent Care 4 days prior and getting "labs checked" - these also reported by the patient as "normal." She reports prior surgical hx significant for hysterectomy and appendectomy.   She denies associated fever, vomiting, urinary symptoms, or BM change. Pain is constant. Pain is worse with prolonged sitting or lifting. She reports using motrin at home with minimal improvement. Her last dose of motrin was yesterday.    The history is provided by the patient.  Abdominal Pain   This is a new problem. The current episode started more than 1 week ago. The problem occurs constantly. The problem has been gradually worsening. The pain is located in the RLQ. The quality of the pain is aching and pressure-like. The pain is mild. Associated symptoms include fever. Pertinent negatives include vomiting and dysuria. Nothing aggravates the symptoms. Nothing relieves the symptoms.    Past Medical History:  Diagnosis Date  . Anal fissure   . Anxiety   . Chest pain    r/t anxiety/panic attacks  . Diverticulosis   . Dizziness    Hx in past - now resolved  . GERD (gastroesophageal reflux disease)    diet controlled, no med  . HA (headache)   . Hyperlipidemia    diet controlled, no med  . Hypertension   . IBS (irritable bowel syndrome)   . SOB (shortness of breath)    on occasion - resolved per patient    Patient Active Problem List   Diagnosis Date Noted  .  Dizziness   . HA (headache)   . HYPERLIPIDEMIA 10/04/2009  . HYPERGLYCEMIA 06/28/2009  . HYPERTENSION 10/14/2006  . Headache(784.0) 10/14/2006  . Overweight(278.02) 02/04/2006    Past Surgical History:  Procedure Laterality Date  . ABDOMINAL HYSTERECTOMY     ovaries remain  . APPENDECTOMY    . BREAST EXCISIONAL BIOPSY Left 2006  . BREAST SURGERY    . COLONOSCOPY    . LAPAROSCOPY    . NECK SURGERY     cyst removed from neck  . RECTAL SURGERY     thromosis/fissure  . TONSILECTOMY, ADENOIDECTOMY, BILATERAL MYRINGOTOMY AND TUBES      OB History    No data available       Home Medications    Prior to Admission medications   Medication Sig Start Date End Date Taking? Authorizing Provider  ipratropium (ATROVENT) 0.03 % nasal spray Place 2 sprays into both nostrils 2 (two) times daily. 03/06/16   Jeffery, Chelle, PA-C  LORazepam (ATIVAN) 0.5 MG tablet Take 0.5 mg by mouth every 8 (eight) hours.    [provider]    Family History Family History  Problem Relation Age of Onset  . Diabetes Mother   . Hypertension Mother   . Colon cancer Father   . Cancer Father   . Rectal cancer Neg Hx   . Stomach cancer Neg Hx   . Esophageal cancer Neg Hx   .  Breast cancer Neg Hx     Social History Social History  Substance Use Topics  . Smoking status: Former Smoker    Types: Cigarettes    Quit date: 04/02/1968  . Smokeless tobacco: Never Used  . Alcohol use 1.8 oz/week    3 Glasses of wine per week     Comment: occasionally     Allergies   Amlodipine besylate; Codeine sulfate; Lisinopril-hydrochlorothiazide; Meperidine hcl; and Vicodin [hydrocodone-acetaminophen]   Review of Systems Review of Systems  Constitutional: Positive for fever.  Gastrointestinal: Positive for abdominal pain. Negative for vomiting.  Genitourinary: Negative for dysuria.  All other systems reviewed and are negative.    Physical Exam Updated Vital Signs BP (!) 176/100   Pulse 73    Temp 98.1 F (36.7 C) (Oral)   Resp 18   Ht 5' 6.5" (1.689 m)   Wt 87.5 kg (193 lb)   SpO2 100%   BMI 30.68 kg/m   Physical Exam  Constitutional: She is oriented to person, place, and time. She appears well-developed and well-nourished. No distress.  HENT:  Head: Normocephalic and atraumatic.  Mouth/Throat: Oropharynx is clear and moist.  Eyes: Pupils are equal, round, and reactive to light. Conjunctivae and EOM are normal.  Neck: Normal range of motion. Neck supple.  Cardiovascular: Normal rate, regular rhythm and normal heart sounds.   No murmur heard. Pulmonary/Chest: Effort normal and breath sounds normal. No respiratory distress. She has no wheezes.  Abdominal: Soft. Bowel sounds are normal. She exhibits no distension. There is tenderness.  Mild tenderness to RLQ and Right Flank   Musculoskeletal: Normal range of motion.  Neurological: She is alert and oriented to person, place, and time.  Skin: Skin is warm and dry.  Psychiatric: She has a normal mood and affect.  Nursing note and vitals reviewed.    ED Treatments / Results  Labs (all labs ordered are listed, but only abnormal results are displayed) Labs Reviewed  URINALYSIS, ROUTINE W REFLEX MICROSCOPIC - Abnormal; Notable for the following:       Result Value   Color, Urine STRAW (*)    All other components within normal limits  LIPASE, BLOOD  COMPREHENSIVE METABOLIC PANEL  CBC    EKG  EKG Interpretation None       Radiology No results found.  Procedures Procedures (including critical care time)  Medications Ordered in ED Medications  ondansetron (ZOFRAN) injection 4 mg (not administered)  ketorolac (TORADOL) 30 MG/ML injection 15 mg (not administered)     Initial Impression / Assessment and Plan / ED Course  I have reviewed the triage vital signs and the nursing notes.  Pertinent labs & imaging results that were available during my care of the patient were reviewed by me and considered in my  medical decision making (see chart for details).  1400 - Improved following ED workup and treatment - She is aware of lab findings and CT results - She is aware of need for close FU - Strict return precautions given and understood   Suspect that patient's reported symptoms today are more likely secondary to radicular pain from her low back. No significant acute intra-abdominal pathology found during ED workup.  She is improved today at time of discharge following ED workup and evaluation and is aware of need for close FU.        Final Clinical Impressions(s) / ED Diagnoses   Final diagnoses:  Flank pain    New Prescriptions New Prescriptions   No medications  on file     Valarie Merino, MD 12/05/16 [phone]

## 2016-12-05 NOTE — ED Notes (Signed)
TWO UNSUCCESSFUL LAB COLLECTIONS ATTEMPTS 

## 2016-12-05 NOTE — ED Triage Notes (Signed)
Pt reports RLQ radiating to lower back x 3 weeks, seen for it and lab work was normal 5 days ago per pt. denies urinary symptoms nor Hx kidney stones. Alert and oriented x 4. Appendectomy 7 years ago.

## 2016-12-30 ENCOUNTER — Ambulatory Visit (INDEPENDENT_AMBULATORY_CARE_PROVIDER_SITE_OTHER): Payer: Managed Care, Other (non HMO) | Admitting: Sports Medicine

## 2016-12-30 VITALS — BP 195/102 | Ht 66.5 in | Wt 190.0 lb

## 2016-12-30 DIAGNOSIS — M25551 Pain in right hip: Secondary | ICD-10-CM

## 2016-12-30 MED ORDER — PREDNISONE 10 MG PO TABS: ORAL_TABLET | ORAL | 0 refills | Status: DC

## 2016-12-30 NOTE — Progress Notes (Signed)
   [facility] Phone: [phone]  Subjective:  [name] is a 56 year old female presenting to clinic for right hip pain for the last month. She initially saw her OB-GYN for this, who thought she had injured her hip. She was then seen in the ED on 12/05/16 and had a CT abdomen/pelvis done, which was negative. She was then seen by her PCP and had a hip x-ray done, which was normal. She was referred to the rheumatologist, who thought she may have iliopsoas bursitis vs tendonitis vs lumbar radicular pain. The rheumatologist obtained x-rays, which showed degenerative disc disease per patient report. She presents to sports medicine clinic today for further evaluation. The pain initially started in her right groin and then started radiating to her right lower back 3-4 days later. The pain feels "stiff" and "achy". The pain is worse with standing and when she first gets up after sleeping or sitting for a long time. The pain is progressively getting worse. She denies any pain, numbness, or tingling radiating down her leg. She has tried Naproxen, which didn't help. She has also tried heat and ice, which haven't helped.   ROS: See HPI for pertinent positives and negatives  Objective: BP (!) 195/102   Ht 5' 6.5" (1.689 m)   Wt 190 lb (86.2 kg)   BMI 30.21 kg/m  Gen: NAD, alert, cooperative with exam Right Hip: No erythema, edema, or gross deformity. No tenderness to palpation of the lateral hip or the hip flexors. Normal ROM of the hip. Negative FABER and FADIR. Negative log roll test. Patient does have reproducible pain with hip extension. Back: No midline tenderness to palpation. Mild tenderness to palpation of the right paraspinal muscles in the lumbar spine. +pain with flexion and extension at the lumbar spine. No pain with rotation to the right or left. Neuro: Straight leg raise negative bilaterally, sensation intact to light touch in the lower extremities bilaterally, 5/5 muscle  strength in the lower extremities, normal gait.  Assessment/Plan: Right Hip Pain: Unclear etiology. May be due to intra-articular hip pathology vs iliopsoas tendonitis, given that she localizes her pain to the groin. Her pain seems less likely to originate from the lumbar spine because of a lack of radicular symptoms. She has had negative hip x-rays and a lumbar spine x-ray that showed degenerative disc disease. - MRI right hip ordered for further evaluation - Prescribed a 6 day dose pack of prednisone to help with inflammation - Further plan pending MRI results   Hyman Bible, MD PGY-3  Patient seen and evaluated with the resident. I agree with the above plan of care. Patient's pain seems to be originating from the right hip. X-rays of her right hip and a CT scan of her abdomen were unremarkable in regards to hip abnormalities. Therefore, we will proceed with an MRI scan of the right hip specifically to rule out iliopsoas bursitis versus synovitis versus possible early AVN. Patient's pain is severe enough that she's had to seek treatment in the emergency room. Will put her on a 6 day Sterapred Dosepak while we wait on the results of the MRI. Phone follow-up with those results when available at which point we will delineate further treatment.

## 2016-12-30 NOTE — Assessment & Plan Note (Signed)
Unclear etiology. May be due to intra-articular hip pathology vs iliopsoas tendonitis, given that she localizes her pain to the groin. Her pain seems less likely to originate from the lumbar spine because of a lack of radicular symptoms. She has had negative hip x-rays and a lumbar spine x-ray that showed degenerative disc disease. - MRI right hip ordered for further evaluation - Prescribed a 6 day dose pack of prednisone to help with inflammation - Further plan pending MRI results

## 2016-12-31 ENCOUNTER — Encounter: Payer: Self-pay | Admitting: Sports Medicine

## 2017-01-05 ENCOUNTER — Other Ambulatory Visit: Payer: Managed Care, Other (non HMO)

## 2017-01-14 ENCOUNTER — Ambulatory Visit
Admission: RE | Admit: 2017-01-14 | Discharge: 2017-01-14 | Disposition: A | Discharge status: Home / Self Care | Payer: Managed Care, Other (non HMO) | Source: Ambulatory Visit | Attending: Sports Medicine | Admitting: Sports Medicine

## 2017-01-14 DIAGNOSIS — M25551 Pain in right hip: Secondary | ICD-10-CM

## 2017-01-15 ENCOUNTER — Other Ambulatory Visit: Payer: Self-pay

## 2017-01-15 ENCOUNTER — Telehealth: Payer: Self-pay | Admitting: Sports Medicine

## 2017-01-15 DIAGNOSIS — M25551 Pain in right hip: Secondary | ICD-10-CM

## 2017-01-15 NOTE — Telephone Encounter (Signed)
  I spoke with the patient on the phone today after reviewing the MRI of her right hip. She has a superior anterior right labral tear. This may very well be her pain generator. I recommended referral to Dr. Zollie Beckers for further evaluation and treatment. I'll defer further evaluation and treatment to the discretion of Dr. Ninfa Linden. Patient will follow-up with me as needed.

## 2017-01-20 ENCOUNTER — Encounter (INDEPENDENT_AMBULATORY_CARE_PROVIDER_SITE_OTHER): Payer: Self-pay | Admitting: Orthopaedic Surgery

## 2017-01-20 ENCOUNTER — Other Ambulatory Visit (INDEPENDENT_AMBULATORY_CARE_PROVIDER_SITE_OTHER): Payer: Self-pay

## 2017-01-20 ENCOUNTER — Ambulatory Visit (INDEPENDENT_AMBULATORY_CARE_PROVIDER_SITE_OTHER): Payer: Managed Care, Other (non HMO) | Admitting: Orthopaedic Surgery

## 2017-01-20 DIAGNOSIS — M25551 Pain in right hip: Secondary | ICD-10-CM | POA: Insufficient documentation

## 2017-01-20 NOTE — Progress Notes (Signed)
Office Visit Note   Patient: [name]           Date of Birth: 17-Jun-1960           MRN: [id number] Visit Date: 01/20/2017              Requested by: Merrilee Seashore, [address] [place], [place] [address] PCP: Merrilee Seashore, MD   Assessment & Plan: Visit Diagnoses:  1. Pain of right hip joint     Plan: At this point the most appropriate thing to try first would be an intra-articular steroid injection she in her right hip to see how he does with this type of injection.  I am hoping this would be a therapeutic and diagnostic injection.  If her pain goes away and then comes back we would send her to a hip arthroscopy specialist.  I showed her hip model and explained in detail her rationale for trying this type of injection first.  I will see her back myself when she has had an injection.  All questions and concerns were answered and addressed.  Follow-Up Instructions: Return in about 3 weeks (around 02/10/2017).   Orders:  No orders of the defined types were placed in this encounter.  No orders of the defined types were placed in this encounter.     Procedures: No procedures performed   Clinical Data: No additional findings.   Subjective: Chief Complaint  Patient presents with  . Right Hip - Pain  The patient is sent to me to evaluate her right hip.  She had a 52-month history of severe right hip acute pain she points to the right groin area and slightly in the abdominal area source of her pain.  She has had a CT scan and MRI.  The MRI was suggestive of superior anterior labral tear.  She had normal cartilage otherwise.  She seen her GYN physician.  The pain was so bad one time she had to get morphine at the hospital due to the severity of it.  She denies any injuries.  She denies any locking catching.  She says the pain is a constant pain and it is a stabbing pain.  HPI  Review of Systems She currently denies any headache, chest pain,  shortness of breath, fever, chills, nausea, vomiting.  Objective: Vital Signs: There were no vitals taken for this visit.  Physical Exam She is alert and oriented x3 and in no acute distress Ortho Exam Examination of her right hip shows fluid internal/external rotation with only some slight pain in the inguinal area more so.  But is painful.  Her range of motion is full and there is no blocks to rotation at all.  The MRI of her right hip is reviewed as well as a CT scan.  She has intact cartilage throughout the hip. Specialty Comments:  No specialty comments available.  Imaging: No results found. There is suggestive of a superior anterior labral tear and some edema in the muscles but this is more on the left side in terms of edema in the muscles.  The right side is where the superior anterior labral tears.  PMFS History: Patient Active Problem List   Diagnosis Date Noted  . Pain of right hip joint 01/20/2017  . Right hip pain 12/30/2016  . Dizziness   . HA (headache)   . HYPERLIPIDEMIA 10/04/2009  . HYPERGLYCEMIA 06/28/2009  . HYPERTENSION 10/14/2006  . Headache(784.0) 10/14/2006  . Overweight(278.02) 02/04/2006   Past  Medical History:  Diagnosis Date  . Anal fissure   . Anxiety   . Chest pain    r/t anxiety/panic attacks  . Diverticulosis   . Dizziness    Hx in past - now resolved  . GERD (gastroesophageal reflux disease)    diet controlled, no med  . HA (headache)   . Hyperlipidemia    diet controlled, no med  . Hypertension   . IBS (irritable bowel syndrome)   . SOB (shortness of breath)    on occasion - resolved per patient    Family History  Problem Relation Age of Onset  . Diabetes Mother   . Hypertension Mother   . Colon cancer Father   . Cancer Father   . Rectal cancer Neg Hx   . Stomach cancer Neg Hx   . Esophageal cancer Neg Hx   . Breast cancer Neg Hx     Past Surgical History:  Procedure Laterality Date  . ABDOMINAL HYSTERECTOMY     ovaries  remain  . APPENDECTOMY    . BREAST EXCISIONAL BIOPSY Left 2006  . BREAST SURGERY    . COLONOSCOPY    . LAPAROSCOPY    . NECK SURGERY     cyst removed from neck  . RECTAL SURGERY     thromosis/fissure  . TONSILECTOMY, ADENOIDECTOMY, BILATERAL MYRINGOTOMY AND TUBES     Social History   Occupational History  . Occupation: Engineer, building services: [employer]    Comment: [employer]  Tobacco Use  . Smoking status: Former Smoker    Types: Cigarettes    Last attempt to quit: 04/02/1968    Years since quitting: 48.8  . Smokeless tobacco: Never Used  Substance and Sexual Activity  . Alcohol use: Yes    Alcohol/week: 1.8 oz    Types: 3 Glasses of wine per week    Comment: occasionally  . Drug use: No  . Sexual activity: Yes    Birth control/protection: Other-see comments    Comment: Hysterectomy

## 2017-01-22 ENCOUNTER — Ambulatory Visit (INDEPENDENT_AMBULATORY_CARE_PROVIDER_SITE_OTHER): Payer: Managed Care, Other (non HMO) | Admitting: Physical Medicine and Rehabilitation

## 2017-01-22 ENCOUNTER — Ambulatory Visit (INDEPENDENT_AMBULATORY_CARE_PROVIDER_SITE_OTHER): Payer: Managed Care, Other (non HMO)

## 2017-01-22 DIAGNOSIS — M25551 Pain in right hip: Secondary | ICD-10-CM | POA: Diagnosis not present

## 2017-01-22 NOTE — Patient Instructions (Signed)

## 2017-01-22 NOTE — Progress Notes (Deleted)
[name] is a 56 y.o female that comes in today to get Right hip injection. Having groin pain and sometimes the pain radiated into lower back on right side. She had MRI done 01/14/17 ordered by Dr Micheline Chapman. Constant pain. (7/10) pain level. Taking Tramadol and Ibuprofen as needed. Currently working sits all day  for 8 hours. States she has seen 7 doctors and no one has found out what's going on.

## 2017-01-22 NOTE — Progress Notes (Signed)
[name] - 56 y.o. female MRN [id number]  Date of birth: 08/23/1960  Office Visit Note: Visit Date: 01/22/2017 PCP: Merrilee Seashore, MD Referred by: Merrilee Seashore, MD  Subjective: Chief Complaint  Patient presents with  . Right Hip - Pain   HPI: [name] is a 56 year old female who comes in today at the request of Dr. Ninfa Linden for diagnostic and therapeutic anesthetic hip arthrogram on the right.  She has MRI evidence of a superior anterior right labral tear.    ROS Otherwise per HPI.  Assessment & Plan: Visit Diagnoses:  1. Pain in right hip     Plan: Findings:  Diagnostic.  Right anesthetic hip arthrogram.  Patient did seem to have relief during the anesthetic phase of the injection.    Meds & Orders: No orders of the defined types were placed in this encounter.  No orders of the defined types were placed in this encounter.   Follow-up: No Follow-up on file.   Procedures: Large Joint Inj: R hip joint on 01/22/2017 10:41 AM Indications: pain and diagnostic evaluation Details: 22 G needle, anterior approach  Arthrogram: Yes  Medications: 80 mg triamcinolone acetonide 40 MG/ML; 3 mL bupivacaine 0.5 % Outcome: tolerated well, no immediate complications  Arthrogram demonstrated excellent flow of contrast throughout the joint surface without extravasation or obvious defect.  The patient had relief of symptoms during the anesthetic phase of the injection.  Procedure, treatment alternatives, risks and benefits explained, specific risks discussed. Consent was given by the patient. Immediately prior to procedure a time out was called to verify the correct patient, procedure, equipment, support staff and site/side marked as required. Patient was prepped and draped in the usual sterile fashion.      No notes on file   Clinical History: MR OF THE RIGHT HIP WITHOUT CONTRAST  TECHNIQUE: Multiplanar, multisequence MR imaging was performed. No  intravenous contrast was administered.  COMPARISON:  None.  FINDINGS: Bones: No hip fracture, dislocation or avascular necrosis. No periosteal reaction or bone destruction. No aggressive osseous lesion.  Normal sacrum and sacroiliac joints. No SI joint widening or erosive changes.  Articular cartilage and labrum  Articular cartilage:  No chondral defect.  Labrum:  Superior anterior right labral tear.  No paralabral cyst.  Joint or bursal effusion  Joint effusion:  No hip joint effusion.  No SI joint effusion.  Bursae:  No bursa formation.  Muscles and tendons  Flexors: Normal.  Extensors: Normal.  Abductors: Normal.  Adductors: Mild muscle edema in the left obturator externus muscle and adductor magnus muscle most consistent with mild muscle strain.  Gluteals: Mild tendinosis of the right gluteus medius insertion.  Hamstrings: Normal.  Other findings  Miscellaneous: No pelvic free fluid. No fluid collection or hematoma. No inguinal lymphadenopathy. No inguinal hernia.  IMPRESSION: 1. Superior anterior right labral tear.  No chondral defect. 2. Mild tendinosis of the right gluteus medius insertion. 3. Mild muscle strain of the left obturator externus and adductor magnus muscles.   Electronically Signed   By: Kathreen Devoid   On: 01/15/2017 08:16  She reports that she quit smoking about 48 years ago. Her smoking use included cigarettes. she has never used smokeless tobacco. No results for input(s): HGBA1C, LABURIC in the last 8760 hours.  Objective:  VS:  HT:    WT:   BMI:     BP:   HR: bpm  TEMP: ( )  RESP:  Physical Exam  Ortho Exam Imaging: No results found.  Past Medical/Family/Surgical/Social History: Medications & Allergies reviewed per EMR Patient Active Problem List   Diagnosis Date Noted  . Pain of right hip joint 01/20/2017  . Right hip pain 12/30/2016  . Dizziness   . HA (headache)   . HYPERLIPIDEMIA 10/04/2009   . HYPERGLYCEMIA 06/28/2009  . HYPERTENSION 10/14/2006  . Headache(784.0) 10/14/2006  . Overweight(278.02) 02/04/2006   Past Medical History:  Diagnosis Date  . Anal fissure   . Anxiety   . Chest pain    r/t anxiety/panic attacks  . Diverticulosis   . Dizziness    Hx in past - now resolved  . GERD (gastroesophageal reflux disease)    diet controlled, no med  . HA (headache)   . Hyperlipidemia    diet controlled, no med  . Hypertension   . IBS (irritable bowel syndrome)   . SOB (shortness of breath)    on occasion - resolved per patient   Family History  Problem Relation Age of Onset  . Diabetes Mother   . Hypertension Mother   . Colon cancer Father   . Cancer Father   . Rectal cancer Neg Hx   . Stomach cancer Neg Hx   . Esophageal cancer Neg Hx   . Breast cancer Neg Hx    Past Surgical History:  Procedure Laterality Date  . ABDOMINAL HYSTERECTOMY     ovaries remain  . APPENDECTOMY    . BREAST EXCISIONAL BIOPSY Left 2006  . BREAST SURGERY    . COLONOSCOPY    . LAPAROSCOPY    . NECK SURGERY     cyst removed from neck  . RECTAL SURGERY     thromosis/fissure  . TONSILECTOMY, ADENOIDECTOMY, BILATERAL MYRINGOTOMY AND TUBES     Social History   Occupational History  . Occupation: Engineer, building services: [employer]    Comment: [employer]  Tobacco Use  . Smoking status: Former Smoker    Types: Cigarettes    Last attempt to quit: 04/02/1968    Years since quitting: 48.8  . Smokeless tobacco: Never Used  Substance and Sexual Activity  . Alcohol use: Yes    Alcohol/week: 1.8 oz    Types: 3 Glasses of wine per week    Comment: occasionally  . Drug use: No  . Sexual activity: Yes    Birth control/protection: Other-see comments    Comment: Hysterectomy

## 2017-02-11 ENCOUNTER — Ambulatory Visit (INDEPENDENT_AMBULATORY_CARE_PROVIDER_SITE_OTHER): Payer: Managed Care, Other (non HMO) | Admitting: Orthopaedic Surgery

## 2017-02-11 ENCOUNTER — Encounter (INDEPENDENT_AMBULATORY_CARE_PROVIDER_SITE_OTHER): Payer: Self-pay | Admitting: Orthopaedic Surgery

## 2017-02-11 DIAGNOSIS — M25551 Pain in right hip: Secondary | ICD-10-CM

## 2017-02-11 NOTE — Progress Notes (Signed)
Ms. Firebaugh returns today follow-up of her right hip status post intra-articular injection by Dr. Ernestina Patches on 12 19/18.  She states that she had pain-free relief of the right hip after the injection for about 2 weeks.  States the pain is slowly beginning to return as of a week ago.  She has pain in the right hip with dressing i.e. putting on shoes socks and also sitting for prolonged period of time.  Then she had an MRI of her right hip which showed a superior anterior labral tear.  Otherwise she had normal cartilage of the hip joint.  Review of systems: Negative outside of HPI  Physical exam: Good range of motion of the right hip without pain.  Ambulates without any assistive device.  Nonantalgic gait.   Impression: Right hip joint pain  Plan: We will refer her to Dr. Aretha Parrot and was to send him up for possible right hip arthroscopy.  Questions encouraged and answered by Dr. Ninfa Linden myself.  She will follow-up with [place] on an as-needed basis.

## 2017-02-12 MED ORDER — TRIAMCINOLONE ACETONIDE 40 MG/ML IJ SUSP
80.0000 mg | INTRAMUSCULAR | Status: AC | PRN
  Administered 2017-01-22: 80 mg via INTRA_ARTICULAR

## 2017-02-12 MED ORDER — BUPIVACAINE HCL 0.5 % IJ SOLN
3.0000 mL | INTRAMUSCULAR | Status: AC | PRN
  Administered 2017-01-22: 3 mL via INTRA_ARTICULAR

## 2017-02-17 ENCOUNTER — Telehealth (INDEPENDENT_AMBULATORY_CARE_PROVIDER_SITE_OTHER): Payer: Self-pay

## 2017-02-17 ENCOUNTER — Other Ambulatory Visit (INDEPENDENT_AMBULATORY_CARE_PROVIDER_SITE_OTHER): Payer: Self-pay

## 2017-02-17 DIAGNOSIS — M25551 Pain in right hip: Secondary | ICD-10-CM

## 2017-02-17 NOTE — Telephone Encounter (Signed)
Patient called concerning a referral to Dr. Aretha Parrot.  CB# is [phone].  Please advise.  Thank You.

## 2017-02-17 NOTE — Telephone Encounter (Signed)
Called and left her a message letting her know that her referral is "in the works"

## 2017-02-28 ENCOUNTER — Telehealth (INDEPENDENT_AMBULATORY_CARE_PROVIDER_SITE_OTHER): Payer: Self-pay | Admitting: Orthopaedic Surgery

## 2017-02-28 NOTE — Telephone Encounter (Signed)
Patient left a voicemail inquiring about the referral to [facility] to Dr. Aretha Parrot. She still hasnt heard anything and is wondering if its anything she needs to do on her part. Please advise # [phone]

## 2017-03-03 NOTE — Telephone Encounter (Signed)
Can you please do me a favor and let her know there is nothing she can do, we have sent her records, they will look and then make appt for her. They should be calling soon.

## 2017-07-09 ENCOUNTER — Other Ambulatory Visit: Payer: Self-pay | Admitting: Internal Medicine

## 2017-07-09 DIAGNOSIS — Z1231 Encounter for screening mammogram for malignant neoplasm of breast: Secondary | ICD-10-CM

## 2017-07-30 ENCOUNTER — Ambulatory Visit
Admission: RE | Admit: 2017-07-30 | Discharge: 2017-07-30 | Disposition: A | Discharge status: Home / Self Care | Payer: Managed Care, Other (non HMO) | Source: Ambulatory Visit | Attending: Internal Medicine | Admitting: Internal Medicine

## 2017-07-30 DIAGNOSIS — Z1231 Encounter for screening mammogram for malignant neoplasm of breast: Secondary | ICD-10-CM

## 2018-03-10 DIAGNOSIS — R1013 Epigastric pain: Secondary | ICD-10-CM | POA: Diagnosis not present

## 2018-06-01 ENCOUNTER — Ambulatory Visit
Admission: EM | Admit: 2018-06-01 | Discharge: 2018-06-01 | Disposition: A | Discharge status: Home / Self Care | Payer: BLUE CROSS/BLUE SHIELD

## 2018-06-01 ENCOUNTER — Other Ambulatory Visit: Payer: Self-pay

## 2018-06-01 ENCOUNTER — Encounter: Payer: Self-pay | Admitting: Family Medicine

## 2018-06-01 DIAGNOSIS — R21 Rash and other nonspecific skin eruption: Secondary | ICD-10-CM

## 2018-06-01 DIAGNOSIS — I10 Essential (primary) hypertension: Secondary | ICD-10-CM

## 2018-06-01 MED ORDER — TRIAMCINOLONE ACETONIDE 0.1 % EX CREA: 1.0000 "application " | TOPICAL_CREAM | Freq: Two times a day (BID) | CUTANEOUS | 0 refills | Status: DC

## 2018-06-01 MED ORDER — METHYLPREDNISOLONE SODIUM SUCC 125 MG IJ SOLR
80.0000 mg | Freq: Once | INTRAMUSCULAR | Status: AC
  Administered 2018-06-01: 17:00:00 80 mg via INTRAMUSCULAR

## 2018-06-01 NOTE — ED Triage Notes (Signed)
Pt presents to [facility] for assessment of over 1 week of rash to neck.  Patient states itchy, not painful.  Believes its poison ivy.

## 2018-06-01 NOTE — ED Notes (Signed)
Patient able to ambulate independently  

## 2018-06-01 NOTE — Discharge Instructions (Signed)
Steroid injection given here in the clinic Steroid cream sent to the pharmacy  Follow up by friday if no improvement.

## 2018-06-02 DIAGNOSIS — Z0000 Encounter for general adult medical examination without abnormal findings: Secondary | ICD-10-CM | POA: Diagnosis not present

## 2018-06-02 DIAGNOSIS — E785 Hyperlipidemia, unspecified: Secondary | ICD-10-CM | POA: Diagnosis not present

## 2018-06-02 DIAGNOSIS — I10 Essential (primary) hypertension: Secondary | ICD-10-CM | POA: Diagnosis not present

## 2018-06-02 DIAGNOSIS — R7309 Other abnormal glucose: Secondary | ICD-10-CM | POA: Diagnosis not present

## 2018-06-02 NOTE — ED Provider Notes (Addendum)
[facility]    CSN: [id number] Arrival date & time: 06/01/18  1648     History   Chief Complaint Chief Complaint  Patient presents with  . Rash    HPI Marjean Imperato is a 58 y.o. female.   Patient is a 58 year old female presents today for rash to neck and chest area.  This is been present and spreading over the past week.  Reporting a lot of itching.  She believes this is most likely poison ivy due to the fact she has been outside trimming bushes and cutting weeds.  She has been putting cornstarch on the area and taking Benadryl.  This seems to help slightly.   Denies any fever, joint pain. Denies any recent changes in lotions, detergents, foods or other possible irritants. No recent travel. Nobody else at home has the rash.  No new foods or medications.   ROS per HPI        Past Medical History:  Diagnosis Date  . Anal fissure   . Anxiety   . Chest pain    r/t anxiety/panic attacks  . Diverticulosis   . Dizziness    Hx in past - now resolved  . GERD (gastroesophageal reflux disease)    diet controlled, no med  . HA (headache)   . Hyperlipidemia    diet controlled, no med  . Hypertension   . IBS (irritable bowel syndrome)   . SOB (shortness of breath)    on occasion - resolved per patient    Patient Active Problem List   Diagnosis Date Noted  . Pain of right hip joint 01/20/2017  . Right hip pain 12/30/2016  . Dizziness   . HA (headache)   . HYPERLIPIDEMIA 10/04/2009  . HYPERGLYCEMIA 06/28/2009  . HYPERTENSION 10/14/2006  . Headache(784.0) 10/14/2006  . Overweight(278.02) 02/04/2006    Past Surgical History:  Procedure Laterality Date  . ABDOMINAL HYSTERECTOMY     ovaries remain  . APPENDECTOMY    . BREAST EXCISIONAL BIOPSY Left 2006  . BREAST SURGERY    . COLONOSCOPY    . LAPAROSCOPY    . NECK SURGERY     cyst removed from neck  . RECTAL SURGERY     thromosis/fissure  . TONSILECTOMY, ADENOIDECTOMY, BILATERAL MYRINGOTOMY AND TUBES       OB History   No obstetric history on file.      Home Medications    Prior to Admission medications   Medication Sig Start Date End Date Taking? Authorizing Provider  metoprolol succinate (TOPROL-XL) 25 MG 24 hr tablet Take 25 mg by mouth 2 (two) times daily.   Yes [provider]  triamcinolone cream (KENALOG) 0.1 % Apply 1 application topically 2 (two) times daily. 06/01/18   Orvan July, NP    Family History Family History  Problem Relation Age of Onset  . Diabetes Mother   . Hypertension Mother   . Colon cancer Father   . Cancer Father   . Rectal cancer Neg Hx   . Stomach cancer Neg Hx   . Esophageal cancer Neg Hx   . Breast cancer Neg Hx     Social History Social History   Tobacco Use  . Smoking status: Former Smoker    Types: Cigarettes    Last attempt to quit: 04/02/1968    Years since quitting: 50.2  . Smokeless tobacco: Never Used  Substance Use Topics  . Alcohol use: Yes    Alcohol/week: 3.0 standard drinks  Types: 3 Glasses of wine per week    Comment: occasionally  . Drug use: No     Allergies   Amlodipine besylate; Codeine; Codeine sulfate; Lisinopril; Lisinopril-hydrochlorothiazide; Meperidine hcl; Vicodin [hydrocodone-acetaminophen]; and Tape   Review of Systems Review of Systems   Physical Exam Triage Vital Signs ED Triage Vitals  Enc Vitals Group     BP 06/01/18 1654 (!) 162/104     Pulse Rate 06/01/18 1654 70     Resp 06/01/18 1654 18     Temp 06/01/18 1654 98.9 F (37.2 C)     Temp Source 06/01/18 1654 Oral     SpO2 06/01/18 1654 97 %     Weight --      Height --      Head Circumference --      Peak Flow --      Pain Score 06/01/18 1655 0     Pain Loc --      Pain Edu? --      Excl. in [place]? --    No data found.  Updated Vital Signs BP (!) 162/104 (BP Location: Right Arm)   Pulse 70   Temp 98.9 F (37.2 C) (Oral)   Resp 18   SpO2 97%   Visual Acuity Right Eye Distance:   Left Eye Distance:    Bilateral Distance:    Right Eye Near:   Left Eye Near:    Bilateral Near:     Physical Exam Vitals signs and nursing note reviewed.  Constitutional:      General: She is not in acute distress.    Appearance: Normal appearance. She is not ill-appearing, toxic-appearing or diaphoretic.  HENT:     Head: Normocephalic and atraumatic.     Nose: Nose normal.     Mouth/Throat:     Pharynx: Oropharynx is clear.  Eyes:     Conjunctiva/sclera: Conjunctivae normal.  Neck:     Musculoskeletal: Normal range of motion.  Pulmonary:     Effort: Pulmonary effort is normal.  Musculoskeletal: Normal range of motion.  Skin:    Findings: Rash present.     Comments: Maculopapular rash to entire anterior neck and chest area. White coating over rash from cornstarch Some vesicles  Neurological:     Mental Status: She is alert.  Psychiatric:        Mood and Affect: Mood normal.      UC Treatments / Results  Labs (all labs ordered are listed, but only abnormal results are displayed) Labs Reviewed - No data to display  EKG None  Radiology No results found.  Procedures Procedures (including critical care time)  Medications Ordered in UC Medications  methylPREDNISolone sodium succinate (SOLU-MEDROL) 125 mg/2 mL injection 80 mg (80 mg Intramuscular Given 06/01/18 1709)    Initial Impression / Assessment and Plan / UC Course  I have reviewed the triage vital signs and the nursing notes.  Pertinent labs & imaging results that were available during my care of the patient were reviewed by me and considered in my medical decision making (see chart for details).     Rash most likely due to poison oak or poison ivy Steroid injection given here in clinic for rash and sent home with some triamcinolone cream to place on the rash Instructed that if her symptoms continue or do not improve slightly by Friday to follow-up Patient is seen and agreed Final Clinical Impressions(s) / UC Diagnoses    Final diagnoses:  Rash and nonspecific skin eruption  Discharge Instructions     Steroid injection given here in the clinic Steroid cream sent to the pharmacy  Follow up by friday if no improvement.     ED Prescriptions    Medication Sig Dispense Auth. Provider   triamcinolone cream (KENALOG) 0.1 % Apply 1 application topically 2 (two) times daily. 30 g Loura Halt A, NP     Controlled Substance Prescriptions [place] Controlled Substance Registry consulted? Not Applicable        Orvan July, NP 06/02/18 [phone]

## 2018-06-16 DIAGNOSIS — Z0000 Encounter for general adult medical examination without abnormal findings: Secondary | ICD-10-CM | POA: Diagnosis not present

## 2018-06-16 DIAGNOSIS — E785 Hyperlipidemia, unspecified: Secondary | ICD-10-CM | POA: Diagnosis not present

## 2018-06-16 DIAGNOSIS — R7309 Other abnormal glucose: Secondary | ICD-10-CM | POA: Diagnosis not present

## 2018-06-16 DIAGNOSIS — M25559 Pain in unspecified hip: Secondary | ICD-10-CM | POA: Diagnosis not present

## 2018-06-16 DIAGNOSIS — E0590 Thyrotoxicosis, unspecified without thyrotoxic crisis or storm: Secondary | ICD-10-CM | POA: Diagnosis not present

## 2018-06-16 DIAGNOSIS — I10 Essential (primary) hypertension: Secondary | ICD-10-CM | POA: Diagnosis not present

## 2018-07-24 ENCOUNTER — Other Ambulatory Visit: Payer: Self-pay | Admitting: Internal Medicine

## 2018-07-24 DIAGNOSIS — Z1231 Encounter for screening mammogram for malignant neoplasm of breast: Secondary | ICD-10-CM

## 2018-08-18 DIAGNOSIS — E0590 Thyrotoxicosis, unspecified without thyrotoxic crisis or storm: Secondary | ICD-10-CM | POA: Diagnosis not present

## 2018-08-25 DIAGNOSIS — R7309 Other abnormal glucose: Secondary | ICD-10-CM | POA: Diagnosis not present

## 2018-08-25 DIAGNOSIS — I10 Essential (primary) hypertension: Secondary | ICD-10-CM | POA: Diagnosis not present

## 2018-08-25 DIAGNOSIS — Z7189 Other specified counseling: Secondary | ICD-10-CM | POA: Diagnosis not present

## 2018-08-25 DIAGNOSIS — E0590 Thyrotoxicosis, unspecified without thyrotoxic crisis or storm: Secondary | ICD-10-CM | POA: Diagnosis not present

## 2018-08-26 ENCOUNTER — Ambulatory Visit
Admission: RE | Admit: 2018-08-26 | Discharge: 2018-08-26 | Disposition: A | Discharge status: Home / Self Care | Payer: Managed Care, Other (non HMO) | Source: Ambulatory Visit | Attending: Internal Medicine | Admitting: Internal Medicine

## 2018-08-26 ENCOUNTER — Other Ambulatory Visit: Payer: Self-pay

## 2018-08-26 DIAGNOSIS — Z1231 Encounter for screening mammogram for malignant neoplasm of breast: Secondary | ICD-10-CM

## 2018-10-31 DIAGNOSIS — R1032 Left lower quadrant pain: Secondary | ICD-10-CM | POA: Diagnosis not present

## 2018-10-31 DIAGNOSIS — K5792 Diverticulitis of intestine, part unspecified, without perforation or abscess without bleeding: Secondary | ICD-10-CM | POA: Diagnosis not present

## 2018-11-09 DIAGNOSIS — Z23 Encounter for immunization: Secondary | ICD-10-CM | POA: Diagnosis not present

## 2019-01-05 ENCOUNTER — Other Ambulatory Visit: Payer: Self-pay | Admitting: Obstetrics and Gynecology

## 2019-01-05 DIAGNOSIS — N644 Mastodynia: Secondary | ICD-10-CM

## 2019-01-08 ENCOUNTER — Ambulatory Visit: Payer: BC Managed Care – PPO

## 2019-01-08 ENCOUNTER — Other Ambulatory Visit: Payer: Self-pay

## 2019-01-08 ENCOUNTER — Ambulatory Visit
Admission: RE | Admit: 2019-01-08 | Discharge: 2019-01-08 | Disposition: A | Discharge status: Home / Self Care | Payer: BC Managed Care – PPO | Source: Ambulatory Visit | Attending: Obstetrics and Gynecology | Admitting: Obstetrics and Gynecology

## 2019-01-08 DIAGNOSIS — R922 Inconclusive mammogram: Secondary | ICD-10-CM | POA: Diagnosis not present

## 2019-01-08 DIAGNOSIS — N644 Mastodynia: Secondary | ICD-10-CM

## 2019-01-14 DIAGNOSIS — Z01419 Encounter for gynecological examination (general) (routine) without abnormal findings: Secondary | ICD-10-CM | POA: Diagnosis not present

## 2019-01-14 DIAGNOSIS — Z6831 Body mass index (BMI) 31.0-31.9, adult: Secondary | ICD-10-CM | POA: Diagnosis not present

## 2019-05-19 DIAGNOSIS — N6320 Unspecified lump in the left breast, unspecified quadrant: Secondary | ICD-10-CM | POA: Diagnosis not present

## 2019-05-19 DIAGNOSIS — Z6829 Body mass index (BMI) 29.0-29.9, adult: Secondary | ICD-10-CM | POA: Diagnosis not present

## 2019-05-20 ENCOUNTER — Other Ambulatory Visit: Payer: Self-pay | Admitting: Obstetrics and Gynecology

## 2019-05-20 DIAGNOSIS — N6320 Unspecified lump in the left breast, unspecified quadrant: Secondary | ICD-10-CM

## 2019-05-21 ENCOUNTER — Ambulatory Visit
Admission: RE | Admit: 2019-05-21 | Discharge: 2019-05-21 | Disposition: A | Discharge status: Home / Self Care | Payer: BC Managed Care – PPO | Source: Ambulatory Visit | Attending: Obstetrics and Gynecology | Admitting: Obstetrics and Gynecology

## 2019-05-21 ENCOUNTER — Other Ambulatory Visit: Payer: Self-pay

## 2019-05-21 DIAGNOSIS — N6320 Unspecified lump in the left breast, unspecified quadrant: Secondary | ICD-10-CM

## 2019-05-21 DIAGNOSIS — D1739 Benign lipomatous neoplasm of skin and subcutaneous tissue of other sites: Secondary | ICD-10-CM | POA: Diagnosis not present

## 2019-05-21 DIAGNOSIS — R922 Inconclusive mammogram: Secondary | ICD-10-CM | POA: Diagnosis not present

## 2019-06-11 ENCOUNTER — Other Ambulatory Visit: Payer: Self-pay

## 2019-06-11 ENCOUNTER — Encounter (HOSPITAL_COMMUNITY): Payer: Self-pay | Admitting: Emergency Medicine

## 2019-06-11 ENCOUNTER — Emergency Department (HOSPITAL_COMMUNITY)
Admission: EM | Admit: 2019-06-11 | Discharge: 2019-06-12 | Disposition: A | Discharge status: Home / Self Care | Payer: BC Managed Care – PPO | Attending: Emergency Medicine | Admitting: Emergency Medicine

## 2019-06-11 ENCOUNTER — Emergency Department (HOSPITAL_COMMUNITY): Payer: BC Managed Care – PPO

## 2019-06-11 DIAGNOSIS — R110 Nausea: Secondary | ICD-10-CM | POA: Insufficient documentation

## 2019-06-11 DIAGNOSIS — R1084 Generalized abdominal pain: Secondary | ICD-10-CM

## 2019-06-11 DIAGNOSIS — R109 Unspecified abdominal pain: Secondary | ICD-10-CM | POA: Diagnosis not present

## 2019-06-11 DIAGNOSIS — K5792 Diverticulitis of intestine, part unspecified, without perforation or abscess without bleeding: Secondary | ICD-10-CM | POA: Diagnosis not present

## 2019-06-11 DIAGNOSIS — R197 Diarrhea, unspecified: Secondary | ICD-10-CM | POA: Diagnosis not present

## 2019-06-11 LAB — URINALYSIS, ROUTINE W REFLEX MICROSCOPIC
Bilirubin Urine: NEGATIVE
Glucose, UA: NEGATIVE mg/dL
Ketones, ur: NEGATIVE mg/dL
Leukocytes,Ua: NEGATIVE
Nitrite: NEGATIVE
Protein, ur: NEGATIVE mg/dL
Specific Gravity, Urine: 1.012 (ref 1.005–1.030)
pH: 5 (ref 5.0–8.0)

## 2019-06-11 LAB — COMPREHENSIVE METABOLIC PANEL
ALT: 15 U/L (ref 0–44)
AST: 14 U/L — ABNORMAL LOW (ref 15–41)
Albumin: 4 g/dL (ref 3.5–5.0)
Alkaline Phosphatase: 91 U/L (ref 38–126)
Anion gap: 10 (ref 5–15)
BUN: 11 mg/dL (ref 6–20)
CO2: 25 mmol/L (ref 22–32)
Calcium: 9.3 mg/dL (ref 8.9–10.3)
Chloride: 105 mmol/L (ref 98–111)
Creatinine, Ser: 0.81 mg/dL (ref 0.44–1.00)
GFR calc Af Amer: >60 mL/min (ref >60)
GFR calc non Af Amer: >60 mL/min (ref >60)
Glucose, Bld: 100 mg/dL — ABNORMAL HIGH (ref 70–99)
Potassium: 3.3 mmol/L — ABNORMAL LOW (ref 3.5–5.1)
Sodium: 140 mmol/L (ref 135–145)
Total Bilirubin: 0.5 mg/dL (ref 0.3–1.2)
Total Protein: 7.7 g/dL (ref 6.5–8.1)

## 2019-06-11 LAB — CBC
HCT: 38.6 % (ref 36.0–46.0)
Hemoglobin: 12.8 g/dL (ref 12.0–15.0)
MCH: 30.4 pg (ref 26.0–34.0)
MCHC: 33.2 g/dL (ref 30.0–36.0)
MCV: 91.7 fL (ref 80.0–100.0)
Platelets: 198 10*3/uL (ref 150–400)
RBC: 4.21 MIL/uL (ref 3.87–5.11)
RDW: 13.7 % (ref 11.5–15.5)
WBC: 11.4 10*3/uL — ABNORMAL HIGH (ref 4.0–10.5)
nRBC: 0 % (ref 0.0–0.2)

## 2019-06-11 LAB — LIPASE, BLOOD: Lipase: 28 U/L (ref 11–51)

## 2019-06-11 MED ORDER — ONDANSETRON HCL 4 MG/2ML IJ SOLN
4.0000 mg | Freq: Once | INTRAMUSCULAR | Status: AC
  Administered 2019-06-11: 4 mg via INTRAVENOUS
  Filled 2019-06-11: qty 2

## 2019-06-11 MED ORDER — FENTANYL CITRATE (PF) 100 MCG/2ML IJ SOLN
100.0000 ug | INTRAMUSCULAR | Status: DC | PRN
  Administered 2019-06-11: 100 ug via INTRAVENOUS
  Filled 2019-06-11: qty 2

## 2019-06-11 MED ORDER — SODIUM CHLORIDE 0.9 % IV BOLUS
500.0000 mL | Freq: Once | INTRAVENOUS | Status: AC
  Administered 2019-06-11: 500 mL via INTRAVENOUS

## 2019-06-11 MED ORDER — SODIUM CHLORIDE (PF) 0.9 % IJ SOLN
INTRAMUSCULAR | Status: AC
  Filled 2019-06-11: qty 50

## 2019-06-11 MED ORDER — SODIUM CHLORIDE 0.9 % IV SOLN: INTRAVENOUS | Status: DC

## 2019-06-11 MED ORDER — IOHEXOL 300 MG/ML  SOLN
100.0000 mL | Freq: Once | INTRAMUSCULAR | Status: AC | PRN
  Administered 2019-06-11: 100 mL via INTRAVENOUS

## 2019-06-11 NOTE — ED Triage Notes (Signed)
Patient c/o sharp abdominal pain with nausea and diarrhea x3 days. Denies vomiting.

## 2019-06-11 NOTE — ED Provider Notes (Signed)
[facility] DEPT Provider Note   CSN: [id number] Arrival date & time: 06/11/19  1921     History Chief Complaint  Patient presents with  . Abdominal Pain    [name] is a 59 y.o. female.  HPI Is here for evaluation of abdominal pain with nausea and diarrhea for several days.  She had 2 episodes of vomiting today.  Yesterday she had numerous episodes of diarrhea.  No hematemesis.  No blood in stool.  Stool is thin and brown in color.  She denies fever, cough, chills, chest pain, weakness or dizziness.  No prior similar problems.  History of hysterectomy and appendectomy.  No known sick contacts.  There are no other known modifying factors.    Past Medical History:  Diagnosis Date  . Anal fissure   . Anxiety   . Chest pain    r/t anxiety/panic attacks  . Diverticulosis   . Dizziness    Hx in past - now resolved  . GERD (gastroesophageal reflux disease)    diet controlled, no med  . HA (headache)   . Hyperlipidemia    diet controlled, no med  . Hypertension   . IBS (irritable bowel syndrome)   . SOB (shortness of breath)    on occasion - resolved per patient    Patient Active Problem List   Diagnosis Date Noted  . Pain of right hip joint 01/20/2017  . Right hip pain 12/30/2016  . Dizziness   . HA (headache)   . HYPERLIPIDEMIA 10/04/2009  . HYPERGLYCEMIA 06/28/2009  . HYPERTENSION 10/14/2006  . Headache(784.0) 10/14/2006  . Overweight(278.02) 02/04/2006    Past Surgical History:  Procedure Laterality Date  . ABDOMINAL HYSTERECTOMY     ovaries remain  . APPENDECTOMY    . BREAST EXCISIONAL BIOPSY Left 2006  . BREAST SURGERY    . COLONOSCOPY    . LAPAROSCOPY    . NECK SURGERY     cyst removed from neck  . RECTAL SURGERY     thromosis/fissure  . TONSILECTOMY, ADENOIDECTOMY, BILATERAL MYRINGOTOMY AND TUBES       OB History   No obstetric history on file.     Family History  Problem Relation Age of Onset  . Diabetes  Mother   . Hypertension Mother   . Colon cancer Father   . Cancer Father   . Rectal cancer Neg Hx   . Stomach cancer Neg Hx   . Esophageal cancer Neg Hx   . Breast cancer Neg Hx     Social History   Tobacco Use  . Smoking status: Former Smoker    Types: Cigarettes    Quit date: 04/02/1968    Years since quitting: 51.2  . Smokeless tobacco: Never Used  Substance Use Topics  . Alcohol use: Yes    Alcohol/week: 3.0 standard drinks    Types: 3 Glasses of wine per week    Comment: occasionally  . Drug use: No    Home Medications Prior to Admission medications   Medication Sig Start Date End Date Taking? Authorizing Provider  metoprolol succinate (TOPROL-XL) 25 MG 24 hr tablet Take 25 mg by mouth 2 (two) times daily.    [provider]  triamcinolone cream (KENALOG) 0.1 % Apply 1 application topically 2 (two) times daily. 06/01/18   Loura Halt A, NP    Allergies    Amlodipine besylate, Codeine, Codeine sulfate, Lisinopril, Lisinopril-hydrochlorothiazide, Meperidine hcl, Vicodin [hydrocodone-acetaminophen], and Tape  Review of Systems   Review  of Systems  All other systems reviewed and are negative.   Physical Exam Updated Vital Signs BP (!) 205/108   Pulse 72   Temp 98.3 F (36.8 C) (Oral)   Resp 18   Ht 5' 6.5" (1.689 m)   Wt 86.2 kg   SpO2 100%   BMI 30.21 kg/m   Physical Exam Vitals and nursing note reviewed.  Constitutional:      General: She is in acute distress (Uncomfortable).     Appearance: She is well-developed. She is not ill-appearing, toxic-appearing or diaphoretic.  HENT:     Head: Normocephalic and atraumatic.  Eyes:     Conjunctiva/sclera: Conjunctivae normal.     Pupils: Pupils are equal, round, and reactive to light.  Neck:     Trachea: Phonation normal.  Cardiovascular:     Rate and Rhythm: Normal rate and regular rhythm.  Pulmonary:     Effort: Pulmonary effort is normal.     Breath sounds: Normal breath sounds.  Chest:      Chest wall: No tenderness.  Abdominal:     General: There is no distension.     Palpations: Abdomen is soft. There is no mass.     Tenderness: There is abdominal tenderness (Diffuse, mild). There is no guarding.     Hernia: No hernia is present.  Musculoskeletal:        General: Normal range of motion.     Cervical back: Normal range of motion and neck supple.  Skin:    General: Skin is warm and dry.  Neurological:     Mental Status: She is alert and oriented to person, place, and time.     Motor: No abnormal muscle tone.  Psychiatric:        Mood and Affect: Mood normal.        Behavior: Behavior normal.        Thought Content: Thought content normal.        Judgment: Judgment normal.     ED Results / Procedures / Treatments   Labs (all labs ordered are listed, but only abnormal results are displayed) Labs Reviewed  COMPREHENSIVE METABOLIC PANEL - Abnormal; Notable for the following components:      Result Value   Potassium 3.3 (*)    Glucose, Bld 100 (*)    AST 14 (*)    All other components within normal limits  CBC - Abnormal; Notable for the following components:   WBC 11.4 (*)    All other components within normal limits  URINALYSIS, ROUTINE W REFLEX MICROSCOPIC - Abnormal; Notable for the following components:   Hgb urine dipstick SMALL (*)    Bacteria, UA MANY (*)    All other components within normal limits  LIPASE, BLOOD    EKG None  Radiology No results found.  Procedures Procedures (including critical care time)  Medications Ordered in ED Medications  0.9 %  sodium chloride infusion ( Intravenous New Bag/Given (Non-Interop) 06/11/19 2242)  fentaNYL (SUBLIMAZE) injection 100 mcg (100 mcg Intravenous Given 06/11/19 2238)  iohexol (OMNIPAQUE) 300 MG/ML solution 100 mL (has no administration in time range)  sodium chloride (PF) 0.9 % injection (has no administration in time range)  sodium chloride 0.9 % bolus 500 mL (500 mLs Intravenous New Bag/Given  (Non-Interop) 06/11/19 2236)  ondansetron (ZOFRAN) injection 4 mg (4 mg Intravenous Given 06/11/19 2237)    ED Course  I have reviewed the triage vital signs and the nursing notes.  Pertinent labs & imaging results  that were available during my care of the patient were reviewed by me and considered in my medical decision making (see chart for details).  Clinical Course as of Jun 10 2332  Fri Jun 11, 2019  2157 Normal except presence of hemoglobin, and many bacteria  Urinalysis, Routine w reflex microscopic(!) [EW]  2158 Normal except potassium low, glucose slightly elevated, AST low  Comprehensive metabolic panel(!) [EW]  99991111 Normal  Lipase, blood [EW]  2158 Normal except white count high  CBC(!) [EW]  2158 Elevated  BP(!): 205/108 [EW]    Clinical Course User Index [EW] Daleen Bo, MD   MDM Rules/Calculators/A&P                       Patient Vitals for the past 24 hrs:  BP Temp Temp src Pulse Resp SpO2 Height Weight  06/11/19 2115 -- -- -- -- -- -- 5' 6.5" (1.689 m) 86.2 kg  06/11/19 2110 (!) 205/108 -- -- 72 18 100 % -- --  06/11/19 1936 (!) 175/103 98.3 F (36.8 C) Oral 70 18 100 % -- --     Medical Decision Making:  This patient is presenting for evaluation of abdominal pain with vomiting diarrhea, which does require a range of treatment options, and is a complaint that involves a moderate risk of morbidity and mortality. The differential diagnoses include gastroenteritis, colitis, bowel obstruction, intra-abdominal infection. I decided  to review old records, and in summary middle-aged female, with history of hypertension and prior abdominal surgeries.  I obtain additional historical information from her sister at the bedside.  Clinical Laboratory Tests Ordered, included CBC, metabolic panel, lipase, urinalysis. Review indicates possible UTI with increased bacteria on urinalysis, elevated white count concerning for intra-abdominal infection, and slightly low  potassium likely related to increased fluid output with vomiting and diarrhea. Radiologic Tests Ordered, included CT abdomen pelvis.   Disposition per Dr. Leonette Monarch following return of CT imaging.  Critical Interventions-clinical evaluation, laboratory assessment, CT imaging, observation    CRITICAL CARE-no Performed by: Daleen Bo   Nursing Notes Reviewed/ Care Coordinated Applicable Imaging Reviewed Interpretation of Laboratory Data incorporated into ED treatment     Final Clinical Impression(s) / ED Diagnoses Final diagnoses:  Generalized abdominal pain    Rx / DC Orders ED Discharge Orders    None       Daleen Bo, MD 06/11/19 2335

## 2019-06-11 NOTE — ED Provider Notes (Signed)
I assumed care of this patient.  Please see previous provider note for further details of Hx, PE.  Briefly patient is a 59 y.o. female who presented abd pain pending CT.  CT revealed uncomplicated diverticulitis.  Patient provided with first dose of oral Augmentin which she was able to tolerate.  Feel she is appropriate for outpatient management.  The patient appears reasonably screened and/or stabilized for discharge and I doubt any other medical condition or other [facility] requiring further screening, evaluation, or treatment in the ED at this time prior to discharge. Safe for discharge with strict return precautions.  Disposition: Discharge  Condition: Good  I have discussed the results, Dx and Tx plan with the patient/family who expressed understanding and agree(s) with the plan. Discharge instructions discussed at length. The patient/family was given strict return precautions who verbalized understanding of the instructions. No further questions at time of discharge.    ED Discharge Orders         Ordered    amoxicillin-clavulanate (AUGMENTIN) 875-125 MG tablet  Every 12 hours     06/12/19 0126    ondansetron (ZOFRAN ODT) 4 MG disintegrating tablet  Every 8 hours PRN     06/12/19 0126    acetaminophen (TYLENOL) 500 MG tablet  Every 8 hours     06/12/19 0126    Hyoscyamine Sulfate SL (LEVSIN/SL) 0.125 MG SUBL  4 times daily PRN     06/12/19 0126    HYDROcodone-acetaminophen (NORCO/VICODIN) 5-325 MG tablet  Every 8 hours PRN     06/12/19 0126          [place] narcotic database reviewed and no active prescriptions noted.    Follow Up: Merrilee Seashore, [place] [address] [phone]  Schedule an appointment as soon as possible for a visit  in 3-5 days, If symptoms do not improve or  worsen         Lamonte Hartt, Grayce Sessions, MD 06/12/19 0127

## 2019-06-12 MED ORDER — HYOSCYAMINE SULFATE SL 0.125 MG SL SUBL
1.0000 | SUBLINGUAL_TABLET | Freq: Four times a day (QID) | SUBLINGUAL | 0 refills | Status: DC | PRN
Start: 2019-06-12 — End: 2019-07-28

## 2019-06-12 MED ORDER — AMOXICILLIN-POT CLAVULANATE 875-125 MG PO TABS: 1.0000 | ORAL_TABLET | Freq: Two times a day (BID) | ORAL | 0 refills | Status: AC

## 2019-06-12 MED ORDER — HYDROCODONE-ACETAMINOPHEN 5-325 MG PO TABS: 0.5000 | ORAL_TABLET | Freq: Three times a day (TID) | ORAL | 0 refills | Status: AC | PRN

## 2019-06-12 MED ORDER — ACETAMINOPHEN 500 MG PO TABS
1000.0000 mg | ORAL_TABLET | Freq: Three times a day (TID) | ORAL | 0 refills | Status: AC
Start: 2019-06-12 — End: 2019-06-17

## 2019-06-12 MED ORDER — AMOXICILLIN-POT CLAVULANATE 875-125 MG PO TABS
1.0000 | ORAL_TABLET | Freq: Once | ORAL | Status: AC
  Administered 2019-06-12: 1 via ORAL
  Filled 2019-06-12: qty 1

## 2019-06-12 MED ORDER — ONDANSETRON 4 MG PO TBDP
4.0000 mg | ORAL_TABLET | Freq: Three times a day (TID) | ORAL | 0 refills | Status: AC | PRN
Start: 2019-06-12 — End: 2019-06-15

## 2019-06-14 ENCOUNTER — Encounter: Payer: Self-pay | Admitting: Gastroenterology

## 2019-07-06 DIAGNOSIS — R7309 Other abnormal glucose: Secondary | ICD-10-CM | POA: Diagnosis not present

## 2019-07-06 DIAGNOSIS — Z0000 Encounter for general adult medical examination without abnormal findings: Secondary | ICD-10-CM | POA: Diagnosis not present

## 2019-07-06 DIAGNOSIS — E785 Hyperlipidemia, unspecified: Secondary | ICD-10-CM | POA: Diagnosis not present

## 2019-07-16 DIAGNOSIS — R7309 Other abnormal glucose: Secondary | ICD-10-CM | POA: Diagnosis not present

## 2019-07-16 DIAGNOSIS — E0590 Thyrotoxicosis, unspecified without thyrotoxic crisis or storm: Secondary | ICD-10-CM | POA: Diagnosis not present

## 2019-07-16 DIAGNOSIS — Z0000 Encounter for general adult medical examination without abnormal findings: Secondary | ICD-10-CM | POA: Diagnosis not present

## 2019-07-16 DIAGNOSIS — I10 Essential (primary) hypertension: Secondary | ICD-10-CM | POA: Diagnosis not present

## 2019-07-28 ENCOUNTER — Ambulatory Visit (INDEPENDENT_AMBULATORY_CARE_PROVIDER_SITE_OTHER): Payer: BC Managed Care – PPO | Admitting: Gastroenterology

## 2019-07-28 ENCOUNTER — Encounter: Payer: Self-pay | Admitting: Gastroenterology

## 2019-07-28 VITALS — BP 150/88 | HR 64 | Ht 66.5 in | Wt 186.4 lb

## 2019-07-28 DIAGNOSIS — R933 Abnormal findings on diagnostic imaging of other parts of digestive tract: Secondary | ICD-10-CM | POA: Diagnosis not present

## 2019-07-28 DIAGNOSIS — Z800 Family history of malignant neoplasm of digestive organs: Secondary | ICD-10-CM | POA: Diagnosis not present

## 2019-07-28 DIAGNOSIS — K5732 Diverticulitis of large intestine without perforation or abscess without bleeding: Secondary | ICD-10-CM

## 2019-07-28 MED ORDER — SUPREP BOWEL PREP KIT 17.5-3.13-1.6 GM/177ML PO SOLN
1.0000 | ORAL | 0 refills | Status: DC
Start: 2019-07-28 — End: 2019-09-01

## 2019-07-28 NOTE — Progress Notes (Signed)
History of Present Illness: This is a 59 year old female referred by Merrilee Seashore, MD for the evaluation of diverticulitis, abnormal CT of the colon, family history of colon cancer.  She relates 2 episodes of diverticulitis within the past 6 months.  Both were treated with outpatient course of antibiotics with complete relief of symptoms.  Second episode symptoms were in a different location and CT scan as below.  She has been avoiding popcorn and knots.  She relates one episode of constipation over the past several years and generally does not have any difficulties with constipation.  No other gastrointestinal complaints. Denies weight loss, diarrhea, change in stool caliber, melena, hematochezia, nausea, vomiting, dysphagia, reflux symptoms, chest pain.  CT AP 5/8/201 IMPRESSION: 1. Acute diverticulitis of the proximal transverse colon. No perforation, fluid collection, or abscess.  Colonoscopy 10/2014 1. Moderate diverticulosis in the sigmoid colon, descending colon, and transverse colon 2. The examination was otherwise normal   Allergies  Allergen Reactions  . Amlodipine Besylate     REACTION: headache  . Codeine     Other reaction(s): Vertigo  . Codeine Sulfate Other (See Comments)    intolerance  . Lisinopril Swelling  . Lisinopril-Hydrochlorothiazide     REACTION: swelling lips/angioedema  . Meperidine Hcl Other (See Comments)    Passed out   . Vicodin [Hydrocodone-Acetaminophen]     GI upset  . Tape Rash    Patient reports unsure if from the adhesive or latex   Outpatient Medications Prior to Visit  Medication Sig Dispense Refill  . metoprolol succinate (TOPROL-XL) 25 MG 24 hr tablet Take 25 mg by mouth 2 (two) times daily.    Marland Kitchen Hyoscyamine Sulfate SL (LEVSIN/SL) 0.125 MG SUBL Place 1 each under the tongue 4 (four) times daily as needed for up to 5 days. 30 tablet 0  . triamcinolone cream (KENALOG) 0.1 % Apply 1 application topically 2 (two) times daily. 30 g 0    No facility-administered medications prior to visit.   Past Medical History:  Diagnosis Date  . Anal fissure   . Anxiety   . Chest pain    r/t anxiety/panic attacks  . Diverticulitis   . Diverticulosis   . Dizziness    Hx in past - now resolved  . GERD (gastroesophageal reflux disease)    diet controlled, no med  . HA (headache)   . Hyperlipidemia    diet controlled, no med  . Hypertension   . IBS (irritable bowel syndrome)   . SOB (shortness of breath)    on occasion - resolved per patient   Past Surgical History:  Procedure Laterality Date  . ABDOMINAL HYSTERECTOMY     ovaries remain  . APPENDECTOMY    . BREAST EXCISIONAL BIOPSY Left 2006  . BREAST SURGERY    . COLONOSCOPY    . HIP SURGERY    . LAPAROSCOPY    . NECK SURGERY     cyst removed from neck  . RECTAL SURGERY     thromosis/fissure  . TONSILECTOMY, ADENOIDECTOMY, BILATERAL MYRINGOTOMY AND TUBES     Social History   Socioeconomic History  . Marital status: Single    Spouse name: n/a  . Number of children: 1  . Years of education: Associate's Degree  . Highest education level: Not on file  Occupational History  . Occupation: Engineer, building services: [employer]    Comment: [employer]  Tobacco Use  . Smoking status: Former Smoker  Types: Cigarettes    Quit date: 04/02/1968    Years since quitting: 51.3  . Smokeless tobacco: Never Used  Substance and Sexual Activity  . Alcohol use: Yes    Alcohol/week: 3.0 standard drinks    Types: 3 Glasses of wine per week    Comment: occasionally  . Drug use: No  . Sexual activity: Yes    Birth control/protection: Other-see comments    Comment: Hysterectomy  Other Topics Concern  . Not on file  Social History Narrative   Patient is single and she works for [employer]. Patient has her associates degree. One child (a son, born in 1).   Caffeine- One iced every three days.   Right handed.   Social Determinants of Health    Financial Resource Strain:   . Difficulty of Paying Living Expenses:   Food Insecurity:   . Worried About Charity fundraiser in the Last Year:   . Arboriculturist in the Last Year:   Transportation Needs:   . Film/video editor (Medical):   Marland Kitchen Lack of Transportation (Non-Medical):   Physical Activity:   . Days of Exercise per Week:   . Minutes of Exercise per Session:   Stress:   . Feeling of Stress :   Social Connections:   . Frequency of Communication with Friends and Family:   . Frequency of Social Gatherings with Friends and Family:   . Attends Religious Services:   . Active Member of Clubs or Organizations:   . Attends Archivist Meetings:   Marland Kitchen Marital Status:    Family History  Problem Relation Age of Onset  . Diabetes Mother   . Hypertension Mother   . Transient ischemic attack Mother   . Colon cancer Father 23  . Rectal cancer Neg Hx   . Stomach cancer Neg Hx   . Esophageal cancer Neg Hx   . Breast cancer Neg Hx       Review of Systems: Pertinent positive and negative review of systems were noted in the above HPI section. All other review of systems were otherwise negative.   Physical Exam: General: Well developed, well nourished, no acute distress Head: Normocephalic and atraumatic Eyes:  sclerae anicteric, EOMI Ears: Normal auditory acuity Mouth: Not examined, mask on during Covid-19 pandemic Neck: Supple, no masses or thyromegaly Lungs: Clear throughout to auscultation Heart: Regular rate and rhythm; no murmurs, rubs or bruits Abdomen: Soft, non tender and non distended. No masses, hepatosplenomegaly or hernias noted. Normal Bowel sounds Rectal: Deferred to colonoscopy  Musculoskeletal: Symmetrical with no gross deformities  Skin: No lesions on visible extremities Pulses:  Normal pulses noted Extremities: No clubbing, cyanosis, edema or deformities noted Neurological: Alert oriented x 4, grossly nonfocal Cervical Nodes:  No significant  cervical adenopathy Inguinal Nodes: No significant inguinal adenopathy Psychological:  Alert and cooperative. Normal mood and affect   Assessment and Recommendations:  1. Recurrent diverticulitis, abnormal CT of the colon, family history of colon cancer, father at age 20.  Maintain a high-fiber diet with adequate daily fluid intake.  Minimize or avoid popcorn nuts, seeds.  Schedule colonoscopy. The risks (including bleeding, perforation, infection, missed lesions, medication reactions and possible hospitalization or surgery if complications occur), benefits, and alternatives to colonoscopy with possible biopsy and possible polypectomy were discussed with the patient and they consent to proceed.     cc: Merrilee Seashore, [address],  [place] [address]

## 2019-07-28 NOTE — Patient Instructions (Addendum)
You have been scheduled for a colonoscopy. Please follow written instructions given to you at your visit today.  Please pick up your prep supplies at the pharmacy within the next 1-3 days. If you use inhalers (even only as needed), please bring them with you on the day of your procedure.  We have sent the following medications to your pharmacy for you to pick up at your convenience: Suprep   Due to recent changes in healthcare laws, you may see the results of your imaging and laboratory studies on MyChart before your provider has had a chance to review them.  We understand that in some cases there may be results that are confusing or concerning to you. Not all laboratory results come back in the same time frame and the provider may be waiting for multiple results in order to interpret others.  Please give [place] 48 hours in order for your provider to thoroughly review all the results before contacting the office for clarification of your results.   Thank you for choosing me and [facility].  Pricilla Riffle. Dagoberto Ligas., MD., [facility]

## 2019-08-23 ENCOUNTER — Other Ambulatory Visit: Payer: Self-pay | Admitting: Internal Medicine

## 2019-08-23 DIAGNOSIS — Z1231 Encounter for screening mammogram for malignant neoplasm of breast: Secondary | ICD-10-CM

## 2019-08-27 ENCOUNTER — Encounter: Payer: Self-pay | Admitting: Gastroenterology

## 2019-09-01 ENCOUNTER — Encounter: Payer: Self-pay | Admitting: Gastroenterology

## 2019-09-01 ENCOUNTER — Ambulatory Visit (AMBULATORY_SURGERY_CENTER): Payer: BC Managed Care – PPO | Admitting: Gastroenterology

## 2019-09-01 ENCOUNTER — Other Ambulatory Visit: Payer: Self-pay

## 2019-09-01 VITALS — BP 109/79 | HR 87 | Temp 97.3°F | Resp 12 | Ht 66.0 in | Wt 186.0 lb

## 2019-09-01 DIAGNOSIS — Z800 Family history of malignant neoplasm of digestive organs: Secondary | ICD-10-CM | POA: Diagnosis not present

## 2019-09-01 DIAGNOSIS — K635 Polyp of colon: Secondary | ICD-10-CM

## 2019-09-01 DIAGNOSIS — R933 Abnormal findings on diagnostic imaging of other parts of digestive tract: Secondary | ICD-10-CM

## 2019-09-01 DIAGNOSIS — K5730 Diverticulosis of large intestine without perforation or abscess without bleeding: Secondary | ICD-10-CM | POA: Diagnosis not present

## 2019-09-01 DIAGNOSIS — K640 First degree hemorrhoids: Secondary | ICD-10-CM

## 2019-09-01 DIAGNOSIS — Z1211 Encounter for screening for malignant neoplasm of colon: Secondary | ICD-10-CM | POA: Diagnosis not present

## 2019-09-01 DIAGNOSIS — D124 Benign neoplasm of descending colon: Secondary | ICD-10-CM

## 2019-09-01 MED ORDER — SODIUM CHLORIDE 0.9 % IV SOLN
500.0000 mL | Freq: Once | INTRAVENOUS | Status: DC
Start: 2019-09-01 — End: 2019-09-01

## 2019-09-01 NOTE — Op Note (Signed)
[facility] Patient Name: [name] Procedure Date: 09/01/2019 2:37 PM MRN: [id number] Endoscopist: Ladene Artist , MD Age: 59 Referring MD:  Date of Birth: 1960-09-18 Gender: Female Account #: [id number] Procedure:                Colonoscopy Indications:              Abnormal CT of the GI tract (colon), Family history                            of colon cancer, first degree relative under 23. Medicines:                Monitored Anesthesia Care Procedure:                Pre-Anesthesia Assessment:                           - Prior to the procedure, a History and Physical                            was performed, and patient medications and                            allergies were reviewed. The patient's tolerance of                            previous anesthesia was also reviewed. The risks                            and benefits of the procedure and the sedation                            options and risks were discussed with the patient.                            All questions were answered, and informed consent                            was obtained. Prior Anticoagulants: The patient has                            taken no previous anticoagulant or antiplatelet                            agents. ASA Grade Assessment: II - A patient with                            mild systemic disease. After reviewing the risks                            and benefits, the patient was deemed in                            satisfactory condition to undergo the procedure.  After obtaining informed consent, the colonoscope                            was passed under direct vision. Throughout the                            procedure, the patient's blood pressure, pulse, and                            oxygen saturations were monitored continuously. The                            Colonoscope was introduced through the anus and                            advanced  to the the cecum, identified by                            appendiceal orifice and ileocecal valve. The                            ileocecal valve, appendiceal orifice, and rectum                            were photographed. The quality of the bowel                            preparation was good. The colonoscopy was somewhat                            difficult due to restricted mobility of the left                            colon and significant looping. The patient                            tolerated the procedure well. Scope In: 2:49:45 PM Scope Out: 3:10:20 PM Scope Withdrawal Time: 0 hours 11 minutes 58 seconds  Total Procedure Duration: 0 hours 20 minutes 35 seconds  Findings:                 The perianal and digital rectal examinations were                            normal.                           A 5 mm polyp was found in the descending colon on                            this inside egde of a large diverticulum. The polyp                            was sessile. The polyp was removed with a cold  biopsy forceps. Resection and retrieval were                            complete.                           Multiple medium-mouthed diverticula were found in                            the left colon. There was narrowing of the colon in                            association with the diverticular opening. There                            was evidence of diverticular spasm. There was                            evidence of an impacted diverticulum. There was no                            evidence of diverticular bleeding.                           Internal hemorrhoids were found during                            retroflexion. The hemorrhoids were medium-sized and                            Grade I (internal hemorrhoids that do not prolapse). Complications:            No immediate complications. Estimated blood loss:                             None. Estimated Blood Loss:     Estimated blood loss: none. Impression:               - One 5 mm polyp in the descending colon in a                            diverticulum, removed with a cold biopsy forceps.                            Resected and retrieved.                           - Moderate diverticulosis in the left colon.                           - Internal hemorrhoids. Recommendation:           - Repeat colonoscopy in 5 years for                            surveillance/screening.                           -  Patient has a contact number available for                            emergencies. The signs and symptoms of potential                            delayed complications were discussed with the                            patient. Return to normal activities tomorrow.                            Written discharge instructions were provided to the                            patient.                           - High fiber diet.                           - Continue present medications.                           - Await pathology results. Ladene Artist, MD 09/01/2019 3:15:06 PM This report has been signed electronically.

## 2019-09-01 NOTE — Progress Notes (Signed)
A/ox3, pleased with MAC, report to RN 

## 2019-09-01 NOTE — Patient Instructions (Addendum)
Handouts given:  Hemorrhoids, Diverticulosis, Polyps, High Fiber diet Start a high fiber diet Continue present medications Await pathology results   YOU HAD AN ENDOSCOPIC PROCEDURE TODAY AT [place]:   Refer to the procedure report that was given to you for any specific questions about what was found during the examination.  If the procedure report does not answer your questions, please call your gastroenterologist to clarify.  If you requested that your care partner not be given the details of your procedure findings, then the procedure report has been included in a sealed envelope for you to review at your convenience later.  YOU SHOULD EXPECT: Some feelings of bloating in the abdomen. Passage of more gas than usual.  Walking can help get rid of the air that was put into your GI tract during the procedure and reduce the bloating. If you had a lower endoscopy (such as a colonoscopy or flexible sigmoidoscopy) you may notice spotting of blood in your stool or on the toilet paper. If you underwent a bowel prep for your procedure, you may not have a normal bowel movement for a few days.  Please Note:  You might notice some irritation and congestion in your nose or some drainage.  This is from the oxygen used during your procedure.  There is no need for concern and it should clear up in a day or so.  SYMPTOMS TO REPORT IMMEDIATELY:   Following lower endoscopy (colonoscopy or flexible sigmoidoscopy):  Excessive amounts of blood in the stool  Significant tenderness or worsening of abdominal pains  Swelling of the abdomen that is new, acute  Fever of 100F or higher   For urgent or emergent issues, a gastroenterologist can be reached at any hour by calling [phone]. Do not use MyChart messaging for urgent concerns.    DIET:  We do recommend a small meal at first, but then you may proceed to your regular diet.  Drink plenty of fluids but you should avoid alcoholic  beverages for 24 hours.  ACTIVITY:  You should plan to take it easy for the rest of today and you should NOT DRIVE or use heavy machinery until tomorrow (because of the sedation medicines used during the test).    FOLLOW UP: Our staff will call the number listed on your records 48-72 hours following your procedure to check on you and address any questions or concerns that you may have regarding the information given to you following your procedure. If we do not reach you, we will leave a message.  We will attempt to reach you two times.  During this call, we will ask if you have developed any symptoms of COVID 19. If you develop any symptoms (ie: fever, flu-like symptoms, shortness of breath, cough etc.) before then, please call [phone].  If you test positive for Covid 19 in the 2 weeks post procedure, please call and report this information to [place].    If any biopsies were taken you will be contacted by phone or by letter within the next 1-3 weeks.  Please call us at [phone] if you have not heard about the biopsies in 3 weeks.    SIGNATURES/CONFIDENTIALITY: You and/or your care partner have signed paperwork which will be entered into your electronic medical record.  These signatures attest to the fact that that the information above on your After Visit Summary has been reviewed and is understood.  Full responsibility of the confidentiality of this discharge information lies with  you and/or your care-partner.

## 2019-09-01 NOTE — Progress Notes (Signed)
Called to room to assist during endoscopic procedure.  Patient ID and intended procedure confirmed with present staff. Received instructions for my participation in the procedure from the performing physician.  

## 2019-09-01 NOTE — Progress Notes (Signed)
Pt's states no medical or surgical changes since previsit or office visit.  Vitals- Courtney 

## 2019-09-03 ENCOUNTER — Telehealth: Payer: Self-pay

## 2019-09-03 ENCOUNTER — Telehealth: Payer: Self-pay | Admitting: *Deleted

## 2019-09-03 NOTE — Telephone Encounter (Signed)
  Follow up Call-  Call back number 09/01/2019  Post procedure Call Back phone  # [phone]  Permission to leave phone message Yes  Some recent data might be hidden     Patient questions:  Do you have a fever, pain , or abdominal swelling? No. Pain Score  0 *  Have you tolerated food without any problems? Yes.    Have you been able to return to your normal activities? Yes.    Do you have any questions about your discharge instructions: Diet   No. Medications  No. Follow up visit  No.  Do you have questions or concerns about your Care? No.  Actions: * If pain score is 4 or above: No action needed, pain <4.  1. Have you developed a fever since your procedure? no  2.   Have you had an respiratory symptoms (SOB or cough) since your procedure? no  3.   Have you tested positive for COVID 19 since your procedure no  4.   Have you had any family members/close contacts diagnosed with the COVID 19 since your procedure?  no   If yes to any of these questions please route to Joylene John, RN and Erenest Rasher, RN

## 2019-09-03 NOTE — Telephone Encounter (Signed)
LVM

## 2019-09-12 ENCOUNTER — Encounter: Payer: Self-pay | Admitting: Gastroenterology

## 2019-09-15 ENCOUNTER — Ambulatory Visit
Admission: RE | Admit: 2019-09-15 | Discharge: 2019-09-15 | Disposition: A | Discharge status: Home / Self Care | Payer: BC Managed Care – PPO | Source: Ambulatory Visit | Attending: Internal Medicine | Admitting: Internal Medicine

## 2019-09-15 ENCOUNTER — Other Ambulatory Visit: Payer: Self-pay

## 2019-09-15 DIAGNOSIS — Z1231 Encounter for screening mammogram for malignant neoplasm of breast: Secondary | ICD-10-CM

## 2019-09-20 ENCOUNTER — Telehealth: Payer: Self-pay | Admitting: Gastroenterology

## 2019-09-20 NOTE — Telephone Encounter (Signed)
Patient did not receive her results in the mail.  I reviewed the pathology results and have mailed her another copy.  She will call back for any additional questions or concerns.

## 2019-09-20 NOTE — Telephone Encounter (Signed)
Left message for patient to call back  

## 2019-09-20 NOTE — Telephone Encounter (Signed)
Pt is requesting a call back from a nurse to discuss the polyp that was sent to the lab  From the colonoscopy she had.

## 2019-10-13 ENCOUNTER — Telehealth: Payer: Self-pay | Admitting: Orthopaedic Surgery

## 2019-10-13 NOTE — Telephone Encounter (Signed)
Patient called advised she is having some hip issues and would like to see one of our providers again. Patient said she had partial repair of her hip in 2019 at [facility]. The number to contact patient is [phone]

## 2019-10-13 NOTE — Telephone Encounter (Signed)
Ok, we can schedule her next available (no work-in)

## 2019-10-14 ENCOUNTER — Telehealth: Payer: Self-pay | Admitting: Orthopaedic Surgery

## 2019-10-14 NOTE — Telephone Encounter (Signed)
Called patient left message to return call to schedule an appointment with Dr. Ninfa Linden or Artis Delay for her hip.

## 2019-11-10 ENCOUNTER — Ambulatory Visit: Payer: Self-pay

## 2019-11-10 ENCOUNTER — Ambulatory Visit (INDEPENDENT_AMBULATORY_CARE_PROVIDER_SITE_OTHER): Payer: BC Managed Care – PPO | Admitting: Orthopaedic Surgery

## 2019-11-10 DIAGNOSIS — M25559 Pain in unspecified hip: Secondary | ICD-10-CM | POA: Diagnosis not present

## 2019-11-10 DIAGNOSIS — M25552 Pain in left hip: Secondary | ICD-10-CM | POA: Diagnosis not present

## 2019-11-10 DIAGNOSIS — M25512 Pain in left shoulder: Secondary | ICD-10-CM | POA: Diagnosis not present

## 2019-11-10 MED ORDER — NABUMETONE 500 MG PO TABS: 500.0000 mg | ORAL_TABLET | Freq: Two times a day (BID) | ORAL | 1 refills | Status: DC | PRN

## 2019-11-10 MED ORDER — METHYLPREDNISOLONE ACETATE 40 MG/ML IJ SUSP
40.0000 mg | INTRAMUSCULAR | Status: AC | PRN
  Administered 2019-11-10: 40 mg via INTRA_ARTICULAR

## 2019-11-10 MED ORDER — LIDOCAINE HCL 1 % IJ SOLN
3.0000 mL | INTRAMUSCULAR | Status: AC | PRN
  Administered 2019-11-10: 3 mL

## 2019-11-10 MED ORDER — METHYLPREDNISOLONE 4 MG PO TABS: ORAL_TABLET | ORAL | 0 refills | Status: DC

## 2019-11-10 NOTE — Progress Notes (Signed)
Office Visit Note   Patient: [name]           Date of Birth: 03-Sep-1960           MRN: [id number] Visit Date: 11/10/2019              Requested by: Merrilee Seashore, [address] [place],  [place] [address] PCP: Merrilee Seashore, MD   Assessment & Plan: Visit Diagnoses:  1. Hip pain   2. Left shoulder pain, unspecified chronicity     Plan: I did recommend a steroid injection in at least her left shoulder today to try to calm down the impingement and pain.  She agreed with this treatment plan and tolerated the injection well.  I would like to see her back in 2 weeks to see how this is done for her left shoulder and to see how anti-inflammatories the numbness and then will do for her right hip.  My next of her right hip would be considering an intra-articular steroid injection for that hip.  All question concerns were answered and addressed.  We will see what she looks like in 2 weeks.  Follow-Up Instructions: Return in about 2 weeks (around 11/24/2019).   Orders:  Orders Placed This Encounter  Procedures  . Large Joint Inj  . XR HIP UNILAT W OR W/O PELVIS 1V RIGHT  . XR Shoulder Left   No orders of the defined types were placed in this encounter.     Procedures: Large Joint Inj: L subacromial bursa on 11/10/2019 3:10 PM Indications: pain and diagnostic evaluation Details: 22 G 1.5 in needle  Arthrogram: No  Medications: 3 mL lidocaine 1 %; 40 mg methylPREDNISolone acetate 40 MG/ML Outcome: tolerated well, no immediate complications Procedure, treatment alternatives, risks and benefits explained, specific risks discussed. Consent was given by the patient. Immediately prior to procedure a time out was called to verify the correct patient, procedure, equipment, support staff and site/side marked as required. Patient was prepped and draped in the usual sterile fashion.       Clinical Data: No additional findings.   Subjective: Chief  Complaint  Patient presents with  . Right Hip - Pain  . Left Shoulder - Pain  The patient is a pleasant 59 year old female who I have seen in the past.  She ended up having a right hip arthroscopy and I believe 2019 over by Dr. Aretha Parrot at [facility]  This was for a labral tear and femoral acetabular impingement.  She has recently started develop right hip pain again and she says is in the groin area.  She is also been dealing with left shoulder pain and popping.  She says her shoulders not been injured before but she reports decreased strength and range of motion has been slowly worsening with time.  She is now diabetic and otherwise an active individual with no acute active medical issues.  HPI  Review of Systems She currently denies any headache, chest pain, shortness of breath, fever, chills, nausea, vomiting  Objective: Vital Signs: There were no vitals taken for this visit.  Physical Exam She is alert and orient x3 and in no acute distress Ortho Exam Examination of her left shoulder shows positive signs of impingement but the shoulder is well located.  She has good strength with rotator cuff but it is painful.  Her right hip exam is entirely normal.  There is no blocks rotation with the right hip. Specialty Comments:  No specialty comments available.  Imaging: XR HIP UNILAT W OR W/O PELVIS 1V RIGHT  Result Date: 11/10/2019 An AP pelvis lateral right hip shows no acute findings.  The hip joint space is well-maintained with no irregularities around the femoral head or acetabulum.  XR Shoulder Left  Result Date: 11/10/2019 3 views of the left shoulder show no acute findings.    PMFS History: Patient Active Problem List   Diagnosis Date Noted  . Pain of right hip joint 01/20/2017  . Right hip pain 12/30/2016  . Dizziness   . HA (headache)   . HYPERLIPIDEMIA 10/04/2009  . HYPERGLYCEMIA 06/28/2009  . HYPERTENSION 10/14/2006  . Headache(784.0) 10/14/2006  .  Overweight(278.02) 02/04/2006   Past Medical History:  Diagnosis Date  . Anal fissure   . Anxiety   . Chest pain    r/t anxiety/panic attacks  . Diverticulitis   . Diverticulosis   . Dizziness    Hx in past - now resolved  . GERD (gastroesophageal reflux disease)    diet controlled, no med  . HA (headache)   . Hyperlipidemia    diet controlled, no med  . Hypertension   . IBS (irritable bowel syndrome)   . SOB (shortness of breath)    on occasion - resolved per patient    Family History  Problem Relation Age of Onset  . Diabetes Mother   . Hypertension Mother   . Transient ischemic attack Mother   . Colon cancer Father 76  . Rectal cancer Neg Hx   . Stomach cancer Neg Hx   . Esophageal cancer Neg Hx   . Breast cancer Neg Hx     Past Surgical History:  Procedure Laterality Date  . ABDOMINAL HYSTERECTOMY     ovaries remain  . APPENDECTOMY    . BREAST EXCISIONAL BIOPSY Left 2006  . BREAST SURGERY    . COLONOSCOPY    . HIP SURGERY    . LAPAROSCOPY    . NECK SURGERY     cyst removed from neck  . RECTAL SURGERY     thromosis/fissure  . TONSILECTOMY, ADENOIDECTOMY, BILATERAL MYRINGOTOMY AND TUBES     Social History   Occupational History  . Occupation: Engineer, building services: [employer]    Comment: [employer]  Tobacco Use  . Smoking status: Former Smoker    Types: Cigarettes    Quit date: 04/02/1968    Years since quitting: 51.6  . Smokeless tobacco: Never Used  Substance and Sexual Activity  . Alcohol use: Yes    Alcohol/week: 3.0 standard drinks    Types: 3 Glasses of wine per week    Comment: occasionally  . Drug use: No  . Sexual activity: Yes    Birth control/protection: Other-see comments    Comment: Hysterectomy

## 2019-11-24 ENCOUNTER — Ambulatory Visit: Payer: BC Managed Care – PPO | Admitting: Orthopaedic Surgery

## 2019-12-04 DIAGNOSIS — K5792 Diverticulitis of intestine, part unspecified, without perforation or abscess without bleeding: Secondary | ICD-10-CM | POA: Diagnosis not present

## 2019-12-08 ENCOUNTER — Other Ambulatory Visit: Payer: Self-pay | Admitting: Physician Assistant

## 2019-12-08 DIAGNOSIS — K5792 Diverticulitis of intestine, part unspecified, without perforation or abscess without bleeding: Secondary | ICD-10-CM

## 2020-03-14 DIAGNOSIS — Z6829 Body mass index (BMI) 29.0-29.9, adult: Secondary | ICD-10-CM | POA: Diagnosis not present

## 2020-03-14 DIAGNOSIS — Z1272 Encounter for screening for malignant neoplasm of vagina: Secondary | ICD-10-CM | POA: Diagnosis not present

## 2020-03-14 DIAGNOSIS — Z01419 Encounter for gynecological examination (general) (routine) without abnormal findings: Secondary | ICD-10-CM | POA: Diagnosis not present

## 2020-04-11 DIAGNOSIS — H93293 Other abnormal auditory perceptions, bilateral: Secondary | ICD-10-CM | POA: Diagnosis not present

## 2020-04-11 DIAGNOSIS — T161XXA Foreign body in right ear, initial encounter: Secondary | ICD-10-CM | POA: Diagnosis not present

## 2020-04-11 DIAGNOSIS — H9201 Otalgia, right ear: Secondary | ICD-10-CM | POA: Diagnosis not present

## 2020-07-21 DIAGNOSIS — Z0000 Encounter for general adult medical examination without abnormal findings: Secondary | ICD-10-CM | POA: Diagnosis not present

## 2020-07-28 DIAGNOSIS — Z0000 Encounter for general adult medical examination without abnormal findings: Secondary | ICD-10-CM | POA: Diagnosis not present

## 2020-07-28 DIAGNOSIS — E785 Hyperlipidemia, unspecified: Secondary | ICD-10-CM | POA: Diagnosis not present

## 2020-07-28 DIAGNOSIS — R7309 Other abnormal glucose: Secondary | ICD-10-CM | POA: Diagnosis not present

## 2020-07-28 DIAGNOSIS — I10 Essential (primary) hypertension: Secondary | ICD-10-CM | POA: Diagnosis not present

## 2020-07-30 DIAGNOSIS — R29810 Facial weakness: Secondary | ICD-10-CM | POA: Diagnosis not present

## 2020-07-30 DIAGNOSIS — R531 Weakness: Secondary | ICD-10-CM | POA: Diagnosis not present

## 2020-07-30 DIAGNOSIS — G4489 Other headache syndrome: Secondary | ICD-10-CM | POA: Diagnosis not present

## 2020-07-30 DIAGNOSIS — I10 Essential (primary) hypertension: Secondary | ICD-10-CM | POA: Diagnosis not present

## 2020-07-31 ENCOUNTER — Other Ambulatory Visit: Payer: Self-pay

## 2020-07-31 ENCOUNTER — Emergency Department (HOSPITAL_COMMUNITY)
Admission: EM | Admit: 2020-07-31 | Discharge: 2020-07-31 | Disposition: A | Discharge status: Home / Self Care | Payer: BC Managed Care – PPO | Attending: Emergency Medicine | Admitting: Emergency Medicine

## 2020-07-31 ENCOUNTER — Emergency Department (HOSPITAL_COMMUNITY): Payer: BC Managed Care – PPO

## 2020-07-31 ENCOUNTER — Encounter (HOSPITAL_COMMUNITY): Payer: Self-pay | Admitting: Emergency Medicine

## 2020-07-31 DIAGNOSIS — I639 Cerebral infarction, unspecified: Secondary | ICD-10-CM | POA: Diagnosis not present

## 2020-07-31 DIAGNOSIS — R0602 Shortness of breath: Secondary | ICD-10-CM | POA: Diagnosis not present

## 2020-07-31 DIAGNOSIS — Z87891 Personal history of nicotine dependence: Secondary | ICD-10-CM | POA: Diagnosis not present

## 2020-07-31 DIAGNOSIS — I10 Essential (primary) hypertension: Secondary | ICD-10-CM | POA: Insufficient documentation

## 2020-07-31 DIAGNOSIS — R1310 Dysphagia, unspecified: Secondary | ICD-10-CM | POA: Insufficient documentation

## 2020-07-31 DIAGNOSIS — R202 Paresthesia of skin: Secondary | ICD-10-CM | POA: Diagnosis not present

## 2020-07-31 DIAGNOSIS — R519 Headache, unspecified: Secondary | ICD-10-CM | POA: Insufficient documentation

## 2020-07-31 DIAGNOSIS — Z79899 Other long term (current) drug therapy: Secondary | ICD-10-CM | POA: Insufficient documentation

## 2020-07-31 DIAGNOSIS — K219 Gastro-esophageal reflux disease without esophagitis: Secondary | ICD-10-CM | POA: Insufficient documentation

## 2020-07-31 DIAGNOSIS — R42 Dizziness and giddiness: Secondary | ICD-10-CM | POA: Diagnosis not present

## 2020-07-31 DIAGNOSIS — R200 Anesthesia of skin: Secondary | ICD-10-CM | POA: Diagnosis not present

## 2020-07-31 DIAGNOSIS — J9859 Other diseases of mediastinum, not elsewhere classified: Secondary | ICD-10-CM | POA: Diagnosis not present

## 2020-07-31 LAB — COMPREHENSIVE METABOLIC PANEL
ALT: 13 U/L (ref 0–44)
AST: 15 U/L (ref 15–41)
Albumin: 3.6 g/dL (ref 3.5–5.0)
Alkaline Phosphatase: 89 U/L (ref 38–126)
Anion gap: 7 (ref 5–15)
BUN: 20 mg/dL (ref 6–20)
CO2: 24 mmol/L (ref 22–32)
Calcium: 9.4 mg/dL (ref 8.9–10.3)
Chloride: 108 mmol/L (ref 98–111)
Creatinine, Ser: 1.24 mg/dL — ABNORMAL HIGH (ref 0.44–1.00)
GFR, Estimated: 50 mL/min — ABNORMAL LOW (ref >60)
Glucose, Bld: 107 mg/dL — ABNORMAL HIGH (ref 70–99)
Potassium: 3.5 mmol/L (ref 3.5–5.1)
Sodium: 139 mmol/L (ref 135–145)
Total Bilirubin: 0.3 mg/dL (ref 0.3–1.2)
Total Protein: 6.8 g/dL (ref 6.5–8.1)

## 2020-07-31 LAB — CBG MONITORING, ED: Glucose-Capillary: 97 mg/dL (ref 70–99)

## 2020-07-31 LAB — I-STAT CHEM 8, ED
BUN: 21 mg/dL — ABNORMAL HIGH (ref 6–20)
Calcium, Ion: 1.25 mmol/L (ref 1.15–1.40)
Chloride: 109 mmol/L (ref 98–111)
Creatinine, Ser: 1.2 mg/dL — ABNORMAL HIGH (ref 0.44–1.00)
Glucose, Bld: 102 mg/dL — ABNORMAL HIGH (ref 70–99)
HCT: 37 % (ref 36.0–46.0)
Hemoglobin: 12.6 g/dL (ref 12.0–15.0)
Potassium: 3.4 mmol/L — ABNORMAL LOW (ref 3.5–5.1)
Sodium: 143 mmol/L (ref 135–145)
TCO2: 23 mmol/L (ref 22–32)

## 2020-07-31 LAB — CBC
HCT: 38.2 % (ref 36.0–46.0)
Hemoglobin: 12.6 g/dL (ref 12.0–15.0)
MCH: 30.1 pg (ref 26.0–34.0)
MCHC: 33 g/dL (ref 30.0–36.0)
MCV: 91.4 fL (ref 80.0–100.0)
Platelets: 171 10*3/uL (ref 150–400)
RBC: 4.18 MIL/uL (ref 3.87–5.11)
RDW: 13.8 % (ref 11.5–15.5)
WBC: 9.5 10*3/uL (ref 4.0–10.5)
nRBC: 0 % (ref 0.0–0.2)

## 2020-07-31 LAB — DIFFERENTIAL
Abs Immature Granulocytes: 0.02 10*3/uL (ref 0.00–0.07)
Basophils Absolute: 0 10*3/uL (ref 0.0–0.1)
Basophils Relative: 0 %
Eosinophils Absolute: 0.1 10*3/uL (ref 0.0–0.5)
Eosinophils Relative: 1 %
Immature Granulocytes: 0 %
Lymphocytes Relative: 34 %
Lymphs Abs: 3.3 10*3/uL (ref 0.7–4.0)
Monocytes Absolute: 0.8 10*3/uL (ref 0.1–1.0)
Monocytes Relative: 8 %
Neutro Abs: 5.3 10*3/uL (ref 1.7–7.7)
Neutrophils Relative %: 57 %

## 2020-07-31 LAB — APTT: aPTT: 29 seconds (ref 24–36)

## 2020-07-31 LAB — PROTIME-INR
INR: 0.9 (ref 0.8–1.2)
Prothrombin Time: 12.6 seconds (ref 11.4–15.2)

## 2020-07-31 LAB — I-STAT BETA HCG BLOOD, ED (MC, WL, AP ONLY): I-stat hCG, quantitative: 5 m[IU]/mL — ABNORMAL HIGH (ref <5)

## 2020-07-31 MED ORDER — DEXAMETHASONE SODIUM PHOSPHATE 10 MG/ML IJ SOLN
10.0000 mg | Freq: Once | INTRAMUSCULAR | Status: AC
  Administered 2020-07-31: 10 mg via INTRAVENOUS
  Filled 2020-07-31: qty 1

## 2020-07-31 MED ORDER — KETOROLAC TROMETHAMINE 30 MG/ML IJ SOLN
30.0000 mg | Freq: Once | INTRAMUSCULAR | Status: AC
  Administered 2020-07-31: 30 mg via INTRAVENOUS
  Filled 2020-07-31: qty 1

## 2020-07-31 MED ORDER — DIPHENHYDRAMINE HCL 50 MG/ML IJ SOLN
25.0000 mg | Freq: Once | INTRAMUSCULAR | Status: AC
  Administered 2020-07-31: 25 mg via INTRAVENOUS
  Filled 2020-07-31: qty 1

## 2020-07-31 MED ORDER — GABAPENTIN 300 MG PO CAPS: 300.0000 mg | ORAL_CAPSULE | Freq: Three times a day (TID) | ORAL | 0 refills | Status: DC

## 2020-07-31 MED ORDER — SODIUM CHLORIDE 0.9 % IV BOLUS
1000.0000 mL | Freq: Once | INTRAVENOUS | Status: AC
  Administered 2020-07-31: 1000 mL via INTRAVENOUS

## 2020-07-31 MED ORDER — METOCLOPRAMIDE HCL 5 MG/ML IJ SOLN
10.0000 mg | Freq: Once | INTRAMUSCULAR | Status: AC
  Administered 2020-07-31: 10 mg via INTRAVENOUS
  Filled 2020-07-31: qty 2

## 2020-07-31 MED ORDER — SODIUM CHLORIDE 0.9% FLUSH
3.0000 mL | Freq: Once | INTRAVENOUS | Status: DC
Start: 2020-07-31 — End: 2020-07-31

## 2020-07-31 NOTE — ED Notes (Signed)
Pt states that her H/a is slightly better tingling in hands and left arm and legs still there , pt able to ambulate back and forth to bathroom with no problems

## 2020-07-31 NOTE — ED Triage Notes (Signed)
Pt BIB [facility] from home, pt LKW 2300 tonight. Pt states she developed left hand numbness and tingling up her left arm, then tingling spreading to her left leg, c/o headache, a&o X 4, no weakness at this time.

## 2020-07-31 NOTE — ED Notes (Signed)
Pt returned from MRI °

## 2020-07-31 NOTE — Discharge Instructions (Addendum)
Begin taking gabapentin as prescribed.  Follow-up with neurology in 1 week.  A referral has been placed to [facility] neurology who should be in touch with you to make these arrangements.  If you have not heard from them in the next 2 days, call the number provided in this discharge summary.  Return to the ER in the meantime if symptoms significantly worsen or change.

## 2020-07-31 NOTE — Consult Note (Signed)
Neurology Consultation Reason for Consult: Left sided numbness Referring Physician: Stark Jock, D  CC: Left-sided numbness  History is obtained from: Patient  HPI: [name] is a 60 y.o. female who was last in her normal state of health at 85 PM when she had onset of tingling in her left hand.  Over the course of about 10 minutes it spread to involve her face as well as her leg.  Continues to tingle.  She then noticed that she began having a headache which she indicates is occipital in location and is associated with photophobic headache.  She denies any history of migraines or headaches that make her want a lie down in a dark room.  She denies any previous similar episodes.   LKW: 11 PM tpa given?: no, mild symptoms   ROS: A 14 point ROS was performed and is negative except as noted in the HPI.  Past Medical History:  Diagnosis Date   Anal fissure    Anxiety    Chest pain    r/t anxiety/panic attacks   Diverticulitis    Diverticulosis    Dizziness    Hx in past - now resolved   GERD (gastroesophageal reflux disease)    diet controlled, no med   HA (headache)    Hyperlipidemia    diet controlled, no med   Hypertension    IBS (irritable bowel syndrome)    SOB (shortness of breath)    on occasion - resolved per patient     Family History  Problem Relation Age of Onset   Diabetes Mother    Hypertension Mother    Transient ischemic attack Mother    Colon cancer Father 52   Rectal cancer Neg Hx    Stomach cancer Neg Hx    Esophageal cancer Neg Hx    Breast cancer Neg Hx      Social History:  reports that she quit smoking about 52 years ago. Her smoking use included cigarettes. She has never used smokeless tobacco. She reports current alcohol use of about 3.0 standard drinks of alcohol per week. She reports that she does not use drugs.   Exam: Current vital signs: BP (!) 164/92 (BP Location: Right Arm)   Pulse (!) 51   Temp 98 F (36.7 C) (Oral)   Resp 12   Ht  5' 6.5" (1.689 m)   Wt 84.6 kg   SpO2 100%   BMI 29.65 kg/m  Vital signs in last 24 hours: Temp:  [98 F (36.7 C)] 98 F (36.7 C) (06/27 0038) Pulse Rate:  [51-56] 51 (06/27 0112) Resp:  [12-20] 12 (06/27 0112) BP: (164-182)/(92-96) 164/92 (06/27 0112) SpO2:  [100 %] 100 % (06/27 0112) Weight:  [84.6 kg] 84.6 kg (06/27 0000)   Physical Exam  Constitutional: Appears well-developed and well-nourished.  Psych: Affect appropriate to situation Eyes: No scleral injection HENT: No OP obstruction MSK: no joint deformities.  Cardiovascular: Normal rate and regular rhythm.  Respiratory: Effort normal, non-labored breathing GI: Soft.  No distension. There is no tenderness.  Skin: WDI  Neuro: Mental Status: Patient is awake, alert, oriented to person, place, month, year, and situation. Patient is able to give a clear and coherent history. No signs of aphasia or neglect Cranial Nerves: II: Visual Fields are full. Pupils are equal, round, and reactive to light.   III,IV, VI: EOMI without ptosis or diploplia.  V: Facial sensation is diminished on the left VII: Facial movement is symmetric.  VIII: hearing is intact to  voice X: Uvula elevates symmetrically XI: Shoulder shrug is symmetric. XII: tongue is midline without atrophy or fasciculations.  Motor: Tone is normal. Bulk is normal. 5/5 strength was present in all four extremities.  Sensory: Sensation is diminished on the left Cerebellar: FNF and HKS are intact bilaterally Gait: Stable casual gait     I have reviewed labs in epic and the results pertinent to this consultation are: Creatinine 1.2  I have reviewed the images obtained: CT head-negative  Impression: 60 year old female with a history of hypertension who presents with left-sided numbness and tingling.  The positive symptoms, gradual spread as well as the association with headache does make me wonder about the possibility of complicated migraine, but certainly  with her age and no clear history of migraines, further imaging is required.  If an MRI is negative, I would favor treating this as complicated migraine.  Recommendations: 1) MRI brain 2) stroke work-up only if positive.   Roland Rack, MD [facility] [phone]  If 7pm- 7am, please page neurology on call as listed in [place].

## 2020-07-31 NOTE — ED Provider Notes (Signed)
[facility] EMERGENCY DEPARTMENT Provider Note   CSN: [id number] Arrival date & time: 07/31/20  0001     History No chief complaint on file.   [name] is a 60 y.o. female.  Patient is a 60 year old female with past medical history of hypertension, hyperlipidemia, GERD, diverticulitis.  Patient presenting today for evaluation of left arm and left leg numbness.  This started at approximately 11 PM in the absence of any injury or trauma.  She does describe occipital headache and difficulty swallowing as well.  Patient found to have normal blood sugar by EMS, but is hypertensive with systolic blood pressure in the 190s.  There are no aggravating or alleviating factors.  The history is provided by the patient.      Past Medical History:  Diagnosis Date   Anal fissure    Anxiety    Chest pain    r/t anxiety/panic attacks   Diverticulitis    Diverticulosis    Dizziness    Hx in past - now resolved   GERD (gastroesophageal reflux disease)    diet controlled, no med   HA (headache)    Hyperlipidemia    diet controlled, no med   Hypertension    IBS (irritable bowel syndrome)    SOB (shortness of breath)    on occasion - resolved per patient    Patient Active Problem List   Diagnosis Date Noted   Pain of right hip joint 01/20/2017   Right hip pain 12/30/2016   Dizziness    HA (headache)    HYPERLIPIDEMIA 10/04/2009   HYPERGLYCEMIA 06/28/2009   HYPERTENSION 10/14/2006   Headache(784.0) 10/14/2006   Overweight(278.02) 02/04/2006    Past Surgical History:  Procedure Laterality Date   ABDOMINAL HYSTERECTOMY     ovaries remain   APPENDECTOMY     BREAST EXCISIONAL BIOPSY Left 2006   BREAST SURGERY     COLONOSCOPY     HIP SURGERY     LAPAROSCOPY     NECK SURGERY     cyst removed from neck   RECTAL SURGERY     thromosis/fissure   TONSILECTOMY, ADENOIDECTOMY, BILATERAL MYRINGOTOMY AND TUBES       OB History   No obstetric history on file.      Family History  Problem Relation Age of Onset   Diabetes Mother    Hypertension Mother    Transient ischemic attack Mother    Colon cancer Father 57   Rectal cancer Neg Hx    Stomach cancer Neg Hx    Esophageal cancer Neg Hx    Breast cancer Neg Hx     Social History   Tobacco Use   Smoking status: Former    Pack years: 0.00    Types: Cigarettes    Quit date: 04/02/1968    Years since quitting: 52.3   Smokeless tobacco: Never  Substance Use Topics   Alcohol use: Yes    Alcohol/week: 3.0 standard drinks    Types: 3 Glasses of wine per week    Comment: occasionally   Drug use: No    Home Medications Prior to Admission medications   Medication Sig Start Date End Date Taking? Authorizing Provider  methylPREDNISolone (MEDROL) 4 MG tablet Medrol dose pack. Take as instructed 11/10/19   Mcarthur Rossetti, MD  metoprolol succinate (TOPROL-XL) 25 MG 24 hr tablet Take 25 mg by mouth 2 (two) times daily.    [provider]  nabumetone (RELAFEN) 500 MG tablet Take 1 tablet (  500 mg total) by mouth 2 (two) times daily as needed. 11/10/19   Mcarthur Rossetti, MD    Allergies    Amlodipine besylate, Codeine, Codeine sulfate, Lisinopril, Lisinopril-hydrochlorothiazide, Meperidine hcl, Vicodin [hydrocodone-acetaminophen], and Tape  Review of Systems   Review of Systems  All other systems reviewed and are negative.  Physical Exam Updated Vital Signs There were no vitals taken for this visit.  Physical Exam Vitals and nursing note reviewed.  Constitutional:      General: She is not in acute distress.    Appearance: She is well-developed. She is not diaphoretic.  HENT:     Head: Normocephalic and atraumatic.  Cardiovascular:     Rate and Rhythm: Normal rate and regular rhythm.     Heart sounds: No murmur heard.   No friction rub. No gallop.  Pulmonary:     Effort: Pulmonary effort is normal. No respiratory distress.     Breath sounds: Normal breath  sounds. No wheezing.  Abdominal:     General: Bowel sounds are normal. There is no distension.     Palpations: Abdomen is soft.     Tenderness: There is no abdominal tenderness.  Musculoskeletal:        General: Normal range of motion.     Cervical back: Normal range of motion and neck supple.  Skin:    General: Skin is warm and dry.  Neurological:     General: No focal deficit present.     Mental Status: She is alert and oriented to person, place, and time.     Cranial Nerves: No cranial nerve deficit.     Motor: No weakness.     Coordination: Coordination normal.    ED Results / Procedures / Treatments   Labs (all labs ordered are listed, but only abnormal results are displayed) Labs Reviewed  PROTIME-INR  APTT  CBC  DIFFERENTIAL  COMPREHENSIVE METABOLIC PANEL  CBG MONITORING, ED  I-STAT CHEM 8, ED  I-STAT BETA HCG BLOOD, ED ([facility], [facility], [facility] ONLY)    EKG EKG Interpretation  Date/Time:  Monday July 31 2020 00:27:06 EDT Ventricular Rate:  56 PR Interval:  178 QRS Duration: 89 QT Interval:  448 QTC Calculation: 433 R Axis:   43 Text Interpretation: Sinus rhythm Normal ECG Confirmed by Veryl Speak [phone]) on 07/31/2020 12:30:54 AM  Radiology No results found.  Procedures Procedures   Medications Ordered in ED Medications  sodium chloride flush (NS) 0.9 % injection 3 mL (has no administration in time range)    ED Course  I have reviewed the triage vital signs and the nursing notes.  Pertinent labs & imaging results that were available during my care of the patient were reviewed by me and considered in my medical decision making (see chart for details).    MDM Rules/Calculators/A&P  Patient brought by EMS for complaints of numbness in her left arm and left leg that started acutely this evening at approximately 11 PM.  Patient's symptoms are mostly subjective with no focal deficit and equal strength.  She was seen immediately upon arrival and sent for stat CT  scan of the head.  This was unremarkable.  Patient also seen by Dr. Leonel Ramsay from neurology who would like an MRI be obtained.  This was performed and was negative for stroke.  She was then given a migraine cocktail with some relief of her headache, however the numbness in her arm and leg persists.  In discussion with Dr. Leonel Ramsay, we will discharge with gabapentin  300 tid for the next two days, outpatient follow up with neurology.    CRITICAL CARE Performed by: Veryl Speak Total critical care time: 45 minutes Critical care time was exclusive of separately billable procedures and treating other patients. Critical care was necessary to treat or prevent imminent or life-threatening deterioration. Critical care was time spent personally by me on the following activities: development of treatment plan with patient and/or surrogate as well as nursing, discussions with consultants, evaluation of patient's response to treatment, examination of patient, obtaining history from patient or surrogate, ordering and performing treatments and interventions, ordering and review of laboratory studies, ordering and review of radiographic studies, pulse oximetry and re-evaluation of patient's condition.   Final Clinical Impression(s) / ED Diagnoses Final diagnoses:  None    Rx / DC Orders ED Discharge Orders     None        Veryl Speak, MD 07/31/20 [phone]

## 2020-07-31 NOTE — Code Documentation (Signed)
Responded to Code Stroke called at 5945 for difficulty swallowing and L sided numbness/tingling, [id number]. Pt arrived at 0001, CBG-97, NIH-1 for sensory, CT head negative for acute changes. TPA not given-"too good to treat." Plan to get MRI. Pt will remain in TPA window unitl 0330. Q72min VS and q30 min neuro checks until pt out of TPA window.

## 2020-07-31 NOTE — ED Notes (Signed)
Pt transported to MRI with this RN, no new neuro deficits noted.

## 2020-08-01 ENCOUNTER — Ambulatory Visit (INDEPENDENT_AMBULATORY_CARE_PROVIDER_SITE_OTHER): Payer: BC Managed Care – PPO | Admitting: Neurology

## 2020-08-01 ENCOUNTER — Encounter: Payer: Self-pay | Admitting: Neurology

## 2020-08-01 VITALS — BP 172/100 | HR 58 | Ht 66.5 in | Wt 185.5 lb

## 2020-08-01 DIAGNOSIS — G459 Transient cerebral ischemic attack, unspecified: Secondary | ICD-10-CM | POA: Insufficient documentation

## 2020-08-01 DIAGNOSIS — R202 Paresthesia of skin: Secondary | ICD-10-CM

## 2020-08-01 NOTE — Progress Notes (Signed)
Chief Complaint  Patient presents with   New Patient (Initial Visit)    Room 16 - alone. Referred for further evaluation of numbness in left arm and left leg. She has just been prescribed gabapentin 300mg , one capsule TID. She has only taken two doses. It causes extreme drowsiness.       ASSESSMENT AND PLAN  [name] is a 60 y.o. female   Transient ischemic attack  Presented with sudden onset left hemiparesthesia since July 30, 2020, has been present since then, also noticed subjective clumsiness of left forearm, decreased left hand grip  Personally reviewed MRI of the brain without contrast on July 31, 2020, no acute abnormality, mild supratentorium small vessel disease  Laboratory evaluation showed normal CBC, mild elevated creatinine 1.2, elevated BUN 21,  The only muscular risk factor for her are hypertension, age  Normal MRI does not rule out the possibility of an acute stroke involving right thalamus  Complete evaluation with echocardiogram, ultrasound of carotid artery, will call for report  Advise her aspirin 81 mg daily, increase water intake, moderate exercise  Continue to follow-up with her primary care physician to address vascular risk factor, patient reported recent normal yearly checkup including laboratory evaluations   DIAGNOSTIC DATA (LABS, IMAGING, TESTING) - I reviewed patient records, labs, notes, testing and imaging myself where available. MRI of the brain on July 31, 2020: No acute abnormality, mild small vessel disease Laboratory evaluations, creatinine 1.2, BUN of 21, normal CBC, hemoglobin of 12.6  HISTORICAL  [name] is a 60 year old female, seen in request by primary care physician Dr.   Ashby Dawes, Mauro Kaufmann, for evaluation of left arm and leg numbness, initial evaluation was on August 01, 2020  I reviewed and summarized the referring note. PMHx. HTN Anxiety, was treated with ativan prn, not an issue any more  Something night July 30, 2020,  around 11 PM while laying in bed watching TV, she had a sudden onset of numbness involving left arm, later also noticed involvement of left face, left leg,  Symptoms consistent at 30 minutes, she called 911, was brought to the emergency room, was seen by neuro hospitalist Dr. Leonel Ramsay, doing evaluation, she still complains of numbness, subjective clumsiness of left hand,  Symptoms has been persistent since its onset  Personally reviewed emergency room evaluation, MRI of the brain showed no acute abnormality, mild small vessel disease  Laboratory evaluation showed normal CBC, slight elevated creatinine 1.2, hemoglobin of 12.6  PHYSICAL EXAM:   Vitals:   08/01/20 1350  BP: (!) 172/100  Pulse: (!) 58  Weight: 185 lb 8 oz (84.1 kg)  Height: 5' 6.5" (1.689 m)   Not recorded     Body mass index is 29.49 kg/m.  PHYSICAL EXAMNIATION:  Gen: NAD, conversant, well nourised, well groomed                     Cardiovascular: Regular rate rhythm, no peripheral edema, warm, nontender. Eyes: Conjunctivae clear without exudates or hemorrhage Neck: Supple, no carotid bruits. Pulmonary: Clear to auscultation bilaterally   NEUROLOGICAL EXAM:  MENTAL STATUS: Speech:    Speech is normal; fluent and spontaneous with normal comprehension.  Cognition:     Orientation to time, place and person     Normal recent and remote memory     Normal Attention span and concentration     Normal Language, naming, repeating,spontaneous speech     Fund of knowledge   CRANIAL NERVES: CN II: Visual fields are  full to confrontation. Pupils are round equal and briskly reactive to light. CN III, IV, VI: extraocular movement are normal. No ptosis. CN V: Facial sensation is intact to light touch CN VII: Face is symmetric with normal eye closure  CN VIII: Hearing is normal to causal conversation. CN IX, X: Phonation is normal. CN XI: Head turning and shoulder shrug are intact  MOTOR: There is no pronator  drift of out-stretched arms. Muscle bulk and tone are normal. Muscle strength is normal.  REFLEXES: Reflexes are 2+ and symmetric at the biceps, triceps, knees, and ankles. Plantar responses are flexor.  SENSORY: Intact to light touch, pinprick and vibratory sensation are intact in fingers and toes.  COORDINATION: There is no trunk or limb dysmetria noted.  GAIT/STANCE: Posture is normal. Gait is steady with normal steps, base, arm swing, and turning. Heel and toe walking are normal. Tandem gait is normal.  Romberg is absent.  REVIEW OF SYSTEMS:  Full 14 system review of systems performed and notable only for as above All other review of systems were negative.   ALLERGIES: Allergies  Allergen Reactions   Amlodipine Besylate     REACTION: headache   Codeine     Other reaction(s): Vertigo   Codeine Sulfate Other (See Comments)    intolerance   Lisinopril Swelling   Lisinopril-Hydrochlorothiazide     REACTION: swelling lips/angioedema   Meperidine Hcl Other (See Comments)    Passed out    Vicodin [Hydrocodone-Acetaminophen]     GI upset   Tape Rash    Patient reports unsure if from the adhesive or latex    HOME MEDICATIONS: Current Outpatient Medications  Medication Sig Dispense Refill   gabapentin (NEURONTIN) 300 MG capsule Take 1 capsule (300 mg total) by mouth 3 (three) times daily. 10 capsule 0   metoprolol tartrate (LOPRESSOR) 25 MG tablet Take 25 mg by mouth 2 (two) times daily.     No current facility-administered medications for this visit.    PAST MEDICAL HISTORY: Past Medical History:  Diagnosis Date   Anal fissure    Anxiety    Chest pain    r/t anxiety/panic attacks   Diverticulitis    Diverticulosis    Dizziness    Hx in past - now resolved   GERD (gastroesophageal reflux disease)    diet controlled, no med   HA (headache)    Hyperlipidemia    diet controlled, no med   Hypertension    IBS (irritable bowel syndrome)    SOB (shortness of  breath)    on occasion - resolved per patient    PAST SURGICAL HISTORY: Past Surgical History:  Procedure Laterality Date   ABDOMINAL HYSTERECTOMY     ovaries remain   APPENDECTOMY     BREAST EXCISIONAL BIOPSY Left 2006   BREAST SURGERY     COLONOSCOPY     HIP SURGERY     LAPAROSCOPY     NECK SURGERY     cyst removed from neck   RECTAL SURGERY     thromosis/fissure   TONSILECTOMY, ADENOIDECTOMY, BILATERAL MYRINGOTOMY AND TUBES      FAMILY HISTORY: Family History  Problem Relation Age of Onset   Diabetes Mother    Hypertension Mother    Transient ischemic attack Mother    Colon cancer Father 70   Rectal cancer Neg Hx    Stomach cancer Neg Hx    Esophageal cancer Neg Hx    Breast cancer Neg Hx     SOCIAL  HISTORY: Social History   Socioeconomic History   Marital status: Single    Spouse name: n/a   Number of children: 1   Years of education: Associate's Degree   Highest education level: Not on file  Occupational History   Occupation: Engineer, building services: [employer]    Comment: [employer]  Tobacco Use   Smoking status: Former    Pack years: 0.00    Types: Cigarettes    Quit date: 04/02/1968    Years since quitting: 52.3   Smokeless tobacco: Never  Substance and Sexual Activity   Alcohol use: Yes    Alcohol/week: 3.0 standard drinks    Types: 3 Glasses of wine per week    Comment: occasionally   Drug use: No   Sexual activity: Yes    Birth control/protection: Other-see comments    Comment: Hysterectomy  Other Topics Concern   Not on file  Social History Narrative   Patient is single and she works for [employer]. Patient has her associates degree. One child (a son, born in 3).   Caffeine- One iced every three days.   Right handed.   Social Determinants of Health   Financial Resource Strain: Not on file  Food Insecurity: Not on file  Transportation Needs: Not on file  Physical Activity: Not on file  Stress: Not on file   Social Connections: Not on file  Intimate Partner Violence: Not on file      Marcial Pacas, M.D. Ph.D.  [facility] [address] [place], [place] [address] Ph: [phone] Fax: [phone]  CC:  Merrilee Seashore, [address] [place],  [place] [address]  Merrilee Seashore, MD

## 2020-08-01 NOTE — Patient Instructions (Addendum)
Asprin 81mg  daily  Increase water intake 64 oz.  Exercise.  Orders Placed This Encounter  Procedures   ECHOCARDIOGRAM COMPLETE   VAS US CAROTID

## 2020-08-03 DIAGNOSIS — R2990 Unspecified symptoms and signs involving the nervous system: Secondary | ICD-10-CM | POA: Diagnosis not present

## 2020-08-03 DIAGNOSIS — G8194 Hemiplegia, unspecified affecting left nondominant side: Secondary | ICD-10-CM | POA: Diagnosis not present

## 2020-08-25 ENCOUNTER — Ambulatory Visit (HOSPITAL_BASED_OUTPATIENT_CLINIC_OR_DEPARTMENT_OTHER)
Admission: RE | Admit: 2020-08-25 | Discharge: 2020-08-25 | Disposition: A | Discharge status: Home / Self Care | Payer: BC Managed Care – PPO | Source: Ambulatory Visit | Attending: Neurology | Admitting: Neurology

## 2020-08-25 ENCOUNTER — Ambulatory Visit (HOSPITAL_COMMUNITY)
Admission: RE | Admit: 2020-08-25 | Discharge: 2020-08-25 | Disposition: A | Discharge status: Home / Self Care | Payer: BC Managed Care – PPO | Source: Ambulatory Visit | Attending: Neurology | Admitting: Neurology

## 2020-08-25 ENCOUNTER — Other Ambulatory Visit: Payer: Self-pay

## 2020-08-25 DIAGNOSIS — R202 Paresthesia of skin: Secondary | ICD-10-CM

## 2020-08-25 DIAGNOSIS — G459 Transient cerebral ischemic attack, unspecified: Secondary | ICD-10-CM | POA: Diagnosis not present

## 2020-08-25 LAB — ECHOCARDIOGRAM COMPLETE
AR max vel: 2.21 cm2
AV Area VTI: 2.18 cm2
AV Area mean vel: 2.18 cm2
AV Mean grad: 4 mmHg
AV Peak grad: 8.4 mmHg
Ao pk vel: 1.45 m/s
Area-P 1/2: 3.03 cm2
MV VTI: 2.24 cm2
S' Lateral: 2.5 cm

## 2020-08-25 NOTE — Progress Notes (Signed)
  Echocardiogram 2D Echocardiogram has been performed.  [name] 08/25/2020, 1:54 PM

## 2020-08-25 NOTE — Progress Notes (Signed)
Carotid artery duplex completed. Refer to "CV Proc" under chart review to view preliminary results.  08/25/2020 12:52 PM Kelby Aline., MHA, RVT, RDCS, RDMS

## 2020-08-28 ENCOUNTER — Telehealth: Payer: Self-pay | Admitting: Neurology

## 2020-08-28 NOTE — Telephone Encounter (Signed)
Pt called and LVM wanting to know if the RN can call her to explain the results she is seeing on her Mychart for her Echo and Carotid. Please advise.

## 2020-08-29 NOTE — Telephone Encounter (Signed)
Dr. Krista Blue sent the patient a mychart message that theses tests showed no significant abnormalities. I called the patient and left this information on her voicemail (ok per DPR). Provided our number to call back if sh has further questions.

## 2020-09-05 ENCOUNTER — Encounter: Payer: Self-pay | Admitting: Neurology

## 2020-09-05 DIAGNOSIS — G8194 Hemiplegia, unspecified affecting left nondominant side: Secondary | ICD-10-CM | POA: Diagnosis not present

## 2020-09-05 DIAGNOSIS — R2990 Unspecified symptoms and signs involving the nervous system: Secondary | ICD-10-CM | POA: Diagnosis not present

## 2020-09-05 DIAGNOSIS — R600 Localized edema: Secondary | ICD-10-CM | POA: Diagnosis not present

## 2020-09-05 DIAGNOSIS — I10 Essential (primary) hypertension: Secondary | ICD-10-CM | POA: Diagnosis not present

## 2020-09-07 DIAGNOSIS — R600 Localized edema: Secondary | ICD-10-CM | POA: Diagnosis not present

## 2020-09-28 NOTE — Progress Notes (Signed)
NEUROLOGY CONSULTATION NOTE  [name] MRN: [id number] DOB: [birth year]  Referring provider: Merrilee Seashore, MD Primary care provider: Merrilee Seashore, MD  Reason for consult:  Left sided numbness and tingling  Assessment/Plan:   I agree that she likely had an MRI-negative right thalamic infarct, likely secondary to small vessel disease Thalamic pain syndrome Hypertension  I would still like to evaluate for possible cardio-embolic source.  Will get 14 day cardiac event monitoring to monitor for a fib ASA '81mg'$  daily Will obtain labs (such as lipid panel) that was performed 2 months ago at her PCP's office.  LDL goal less than 70 and Hgb A1c goal less than 7 Normotensive blood pressure Titrate gabapentin up to '100mg'$  TID for one week, then '200mg'$  TID for one week, then '300mg'$  TID Follow up in 6 months.    Subjective:  [name] is a 60 year old right-handed female with HTN, HLD, IBS, GERD who presents for left-handed numbness and tingling.  This is not a transfer of care.  CT and MRI of brain personally reviewed.  At 11 PM on 07/30/2020, she was sitting up in bed watching TIV when she developed sudden onset numbness and tingling of the entire left arm. About 15 minutes later, it progressed to the lateral left leg and left side of her head and face.  She had a left occipital headache but no associated weakness or speech disturbance.  EMS was called.  Blood sugar was normal but systolic blood pressure was elevated in the 190s.  She was brought to [facility] ED where CT head was negative for acute stroke.  MRI of brain showed mild chronic small vessel ischemic changes but no acute stroke.  She was treated for a headache cocktail which helped with the headache but the numbness persisted.  She was discharged on gabapentin.  She followed up with outpatient neurology, Dr. Marcial Pacas, a couple of days later.  MRI-negative right thalamic stroke was suspected.  Echocardiogram on 08/25/2020  showed EF 60-65% with no cardiac source of embolus.  Carotid ultrasound showed no hemodynamically significant stenosis.  She was started on ASA '81mg'$  daily.  She reports that the pain and discomfort in her arm and leg have increased.  She says that her left hand feels weak.  The '300mg'$  gabapentin was too strong, so her PCP has restarted her on '100mg'$  at bedtime for 2 weeks with plan to increase to '200mg'$  at bedtime.    PAST MEDICAL HISTORY: Past Medical History:  Diagnosis Date   Anal fissure    Anxiety    Chest pain    r/t anxiety/panic attacks   Diverticulitis    Diverticulosis    Dizziness    Hx in past - now resolved   GERD (gastroesophageal reflux disease)    diet controlled, no med   HA (headache)    Hyperlipidemia    diet controlled, no med   Hypertension    IBS (irritable bowel syndrome)    SOB (shortness of breath)    on occasion - resolved per patient    PAST SURGICAL HISTORY: Past Surgical History:  Procedure Laterality Date   ABDOMINAL HYSTERECTOMY     ovaries remain   APPENDECTOMY     BREAST EXCISIONAL BIOPSY Left 2006   BREAST SURGERY     COLONOSCOPY     HIP SURGERY     LAPAROSCOPY     NECK SURGERY     cyst removed from neck   RECTAL SURGERY  thromosis/fissure   TONSILECTOMY, ADENOIDECTOMY, BILATERAL MYRINGOTOMY AND TUBES      MEDICATIONS: Current Outpatient Medications on File Prior to Visit  Medication Sig Dispense Refill   gabapentin (NEURONTIN) 300 MG capsule Take 1 capsule (300 mg total) by mouth 3 (three) times daily. 10 capsule 0   metoprolol tartrate (LOPRESSOR) 25 MG tablet Take 25 mg by mouth 2 (two) times daily.     No current facility-administered medications on file prior to visit.    ALLERGIES: Allergies  Allergen Reactions   Amlodipine Besylate     REACTION: headache   Codeine     Other reaction(s): Vertigo   Codeine Sulfate Other (See Comments)    intolerance   Lisinopril Swelling   Lisinopril-Hydrochlorothiazide     REACTION:  swelling lips/angioedema   Meperidine Hcl Other (See Comments)    Passed out    Vicodin [Hydrocodone-Acetaminophen]     GI upset   Tape Rash    Patient reports unsure if from the adhesive or latex    FAMILY HISTORY: Family History  Problem Relation Age of Onset   Diabetes Mother    Hypertension Mother    Transient ischemic attack Mother    Colon cancer Father 13   Rectal cancer Neg Hx    Stomach cancer Neg Hx    Esophageal cancer Neg Hx    Breast cancer Neg Hx     Objective:  Blood pressure (!) 176/108, pulse 67, resp. rate 20, height 5' 6.5" (1.689 m), weight 183 lb (83 kg), SpO2 97 %. General: No acute distress.  Patient appears well-groomed.   Head:  Normocephalic/atraumatic Eyes:  fundi examined but not visualized Neck: supple, no paraspinal tenderness, full range of motion Back: No paraspinal tenderness Heart: regular rate and rhythm Lungs: Clear to auscultation bilaterally. Vascular: No carotid bruits. Neurological Exam: Mental status: alert and oriented to person, place, and time, recent and remote memory intact, fund of knowledge intact, attention and concentration intact, speech fluent and not dysarthric, language intact. Cranial nerves: CN I: not tested CN II: pupils equal, round and reactive to light, visual fields intact CN III, IV, VI:  full range of motion, no nystagmus, no ptosis CN V: facial sensation intact. CN VII: upper and lower face symmetric CN VIII: hearing intact CN IX, X: gag intact, uvula midline CN XI: sternocleidomastoid and trapezius muscles intact CN XII: tongue midline Bulk & Tone: normal, no fasciculations. Motor:  muscle strength 5/5 throughout Sensation:  hyperesthesia of left upper and lower extremities to pinprick.  Vibratory sensation intact.. Deep Tendon Reflexes:  2+ throughout,  toes downgoing.   Finger to nose testing:  Without dysmetria.   Heel to shin:  Without dysmetria.   Gait:  Normal station and stride.  Romberg  negative.    Thank you for allowing me to take part in the care of this patient.  Metta Clines, DO  CC: Merrilee Seashore, MD

## 2020-09-29 ENCOUNTER — Ambulatory Visit (INDEPENDENT_AMBULATORY_CARE_PROVIDER_SITE_OTHER): Payer: BC Managed Care – PPO | Admitting: Neurology

## 2020-09-29 ENCOUNTER — Other Ambulatory Visit: Payer: Self-pay

## 2020-09-29 ENCOUNTER — Encounter: Payer: Self-pay | Admitting: Neurology

## 2020-09-29 VITALS — BP 176/108 | HR 67 | Resp 20 | Ht 66.5 in | Wt 183.0 lb

## 2020-09-29 DIAGNOSIS — G890 Central pain syndrome: Secondary | ICD-10-CM | POA: Diagnosis not present

## 2020-09-29 DIAGNOSIS — I10 Essential (primary) hypertension: Secondary | ICD-10-CM

## 2020-09-29 DIAGNOSIS — I6381 Other cerebral infarction due to occlusion or stenosis of small artery: Secondary | ICD-10-CM

## 2020-09-29 DIAGNOSIS — I639 Cerebral infarction, unspecified: Secondary | ICD-10-CM

## 2020-09-29 MED ORDER — GABAPENTIN 100 MG PO CAPS: ORAL_CAPSULE | ORAL | 0 refills | Status: DC

## 2020-09-29 NOTE — Patient Instructions (Signed)
Increase gabapentin as directed.  Contact me for refills Continue aspirin '81mg'$  daily Will get a ZIO patch 14 day cardiac monitor Will obtain cholesterol labs from your PCP.  May need to start a medication Follow up 6 months.

## 2020-10-03 ENCOUNTER — Ambulatory Visit (INDEPENDENT_AMBULATORY_CARE_PROVIDER_SITE_OTHER): Payer: BC Managed Care – PPO

## 2020-10-03 DIAGNOSIS — I4891 Unspecified atrial fibrillation: Secondary | ICD-10-CM | POA: Diagnosis not present

## 2020-10-03 DIAGNOSIS — I639 Cerebral infarction, unspecified: Secondary | ICD-10-CM

## 2020-10-03 DIAGNOSIS — G890 Central pain syndrome: Secondary | ICD-10-CM

## 2020-10-03 DIAGNOSIS — I10 Essential (primary) hypertension: Secondary | ICD-10-CM

## 2020-10-03 DIAGNOSIS — I6381 Other cerebral infarction due to occlusion or stenosis of small artery: Secondary | ICD-10-CM

## 2020-10-06 DIAGNOSIS — R2990 Unspecified symptoms and signs involving the nervous system: Secondary | ICD-10-CM | POA: Diagnosis not present

## 2020-10-06 DIAGNOSIS — I10 Essential (primary) hypertension: Secondary | ICD-10-CM | POA: Diagnosis not present

## 2020-10-06 DIAGNOSIS — G8194 Hemiplegia, unspecified affecting left nondominant side: Secondary | ICD-10-CM | POA: Diagnosis not present

## 2020-10-18 ENCOUNTER — Other Ambulatory Visit: Payer: Self-pay | Admitting: Neurology

## 2020-10-18 DIAGNOSIS — I4891 Unspecified atrial fibrillation: Secondary | ICD-10-CM

## 2020-10-18 DIAGNOSIS — I639 Cerebral infarction, unspecified: Secondary | ICD-10-CM

## 2020-10-18 DIAGNOSIS — I6381 Other cerebral infarction due to occlusion or stenosis of small artery: Secondary | ICD-10-CM

## 2020-10-18 DIAGNOSIS — G890 Central pain syndrome: Secondary | ICD-10-CM

## 2020-10-18 DIAGNOSIS — I10 Essential (primary) hypertension: Secondary | ICD-10-CM

## 2020-10-25 ENCOUNTER — Telehealth: Payer: Self-pay

## 2020-10-25 NOTE — Telephone Encounter (Signed)
Called patient and left a message for a call back.  

## 2020-10-26 DIAGNOSIS — I10 Essential (primary) hypertension: Secondary | ICD-10-CM | POA: Diagnosis not present

## 2020-10-26 DIAGNOSIS — R2990 Unspecified symptoms and signs involving the nervous system: Secondary | ICD-10-CM | POA: Diagnosis not present

## 2020-10-26 DIAGNOSIS — G8194 Hemiplegia, unspecified affecting left nondominant side: Secondary | ICD-10-CM | POA: Diagnosis not present

## 2020-11-02 ENCOUNTER — Other Ambulatory Visit: Payer: Self-pay | Admitting: Internal Medicine

## 2020-11-02 DIAGNOSIS — Z1231 Encounter for screening mammogram for malignant neoplasm of breast: Secondary | ICD-10-CM

## 2020-11-06 ENCOUNTER — Telehealth: Payer: Self-pay | Admitting: Neurology

## 2020-11-06 NOTE — Telephone Encounter (Signed)
Pt advised to Recommend following up with PCP to make sure she doesn't have a DVT. Per Dr.Jaffe.

## 2020-11-06 NOTE — Telephone Encounter (Signed)
Patient called and said she is continuing to have left arm swelling since having her stroke earlier this year. She said the swelling has not gone down despite exercising. She'd like the doctors recommendations.

## 2020-11-06 NOTE — Telephone Encounter (Signed)
Telephone call to pt, Pt c/o pian and swelling from the shoulder down to to her finger tip. No Pain radiating up the shoulder to the neck. Some numbness as well since three weeks after her stroke.

## 2020-11-27 ENCOUNTER — Ambulatory Visit: Payer: BC Managed Care – PPO | Admitting: Neurology

## 2020-11-29 ENCOUNTER — Ambulatory Visit
Admission: RE | Admit: 2020-11-29 | Discharge: 2020-11-29 | Disposition: A | Discharge status: Home / Self Care | Payer: BC Managed Care – PPO | Source: Ambulatory Visit | Attending: Internal Medicine | Admitting: Internal Medicine

## 2020-11-29 ENCOUNTER — Other Ambulatory Visit: Payer: Self-pay

## 2020-11-29 DIAGNOSIS — Z1231 Encounter for screening mammogram for malignant neoplasm of breast: Secondary | ICD-10-CM | POA: Diagnosis not present

## 2020-12-05 ENCOUNTER — Other Ambulatory Visit: Payer: Self-pay | Admitting: *Deleted

## 2020-12-05 DIAGNOSIS — M7989 Other specified soft tissue disorders: Secondary | ICD-10-CM

## 2020-12-10 NOTE — Progress Notes (Signed)
Office Note     CC: Left arm swelling Requesting Provider:  Merrilee Seashore, MD  HPI: [name] is a 60 y.o. (Jun 15, 1960) female who presents at the request of Merrilee Seashore, MD for evaluation of left arm swelling.  In June of this year, the patient had a stroke resulting in left-sided deficits.  She continues to have sequela including numbness and tingling in the left arm and leg.  Fortunately, her motor function has improved, now with only slight weakness appreciated on strength examination.  For the last several months, she is also appreciated left arm swelling, and heaviness.  Per [name], her skin feels tight, especially with flexion at the elbow.  She denies increased pain with use, rest pain, ulceration.  She has no history of DVT.  No family history of lymphedema.  No history of breast cancer, most recent mammogram normal.   [name] has returned to work, and since most of her day sitting and typing.  While the heaviness is bothersome, she is still able to feel her occupational needs.  [name] has not attempted compression, no previous vein procedures.  The pt not on a statin for cholesterol management.  The pt not on a daily aspirin.   Other AC:  - The pt is on medications for hypertension.   The pt not diabetic.   Tobacco hx:  former  Past Medical History:  Diagnosis Date   Anal fissure    Anxiety    Chest pain    r/t anxiety/panic attacks   Diverticulitis    Diverticulosis    Dizziness    Hx in past - now resolved   GERD (gastroesophageal reflux disease)    diet controlled, no med   HA (headache)    Hyperlipidemia    diet controlled, no med   Hypertension    IBS (irritable bowel syndrome)    SOB (shortness of breath)    on occasion - resolved per patient    Past Surgical History:  Procedure Laterality Date   ABDOMINAL HYSTERECTOMY     ovaries remain   APPENDECTOMY     BREAST EXCISIONAL BIOPSY Left 2006   BREAST SURGERY     COLONOSCOPY     HIP  SURGERY     LAPAROSCOPY     NECK SURGERY     cyst removed from neck   RECTAL SURGERY     thromosis/fissure   TONSILECTOMY, ADENOIDECTOMY, BILATERAL MYRINGOTOMY AND TUBES      Social History   Socioeconomic History   Marital status: Single    Spouse name: n/a   Number of children: 1   Years of education: Associate's Degree   Highest education level: Not on file  Occupational History   Occupation: Engineer, 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 Patient has her associates degree. One child (a son,  born in 85).   Caffeine- One iced every three days.   Right handed.   Two story home   Social Determinants of Health   Financial Resource Strain: Not on file  Food Insecurity: Not on file  Transportation Needs: Not on file  Physical Activity: Not on file  Stress: Not on file  Social Connections: Not on file  Intimate Partner Violence: Not on file    Family History  Problem Relation Age of Onset   Diabetes Mother    Hypertension Mother    Transient ischemic attack Mother    Colon cancer Father 72   Rectal cancer Neg Hx    Stomach cancer Neg Hx    Esophageal cancer Neg Hx    Breast cancer Neg Hx     Current Outpatient Medications  Medication Sig Dispense Refill   amLODipine (NORVASC) 2.5 MG tablet Take 2.5 mg by mouth daily.     gabapentin (NEURONTIN) 100 MG capsule Take 1 capsule three times daily for one week, then 2 capsules  three times daily for one week, then 3 capsules three times daily 270 capsule 0   metoprolol tartrate (LOPRESSOR) 25 MG tablet Take 25 mg by mouth 2 (two) times daily.     No current facility-administered medications for this visit.    Allergies  Allergen Reactions   Amlodipine Besylate     REACTION: headache   Codeine     Other reaction(s): Vertigo   Codeine Sulfate Other (See Comments)    intolerance   Lisinopril Swelling   Lisinopril-Hydrochlorothiazide     REACTION: swelling lips/angioedema   Meperidine Hcl Other (See Comments)    Passed out    Vicodin [Hydrocodone-Acetaminophen]     GI upset   Tape Rash    Patient reports unsure if from the adhesive or latex     REVIEW OF SYSTEMS:   [X]  denotes positive finding, [ ]  denotes negative finding Cardiac  Comments:  Chest pain or chest pressure:    Shortness of breath upon exertion:    Short of breath when lying flat:    Irregular heart rhythm:        Vascular    Pain in calf, thigh, or hip brought on by ambulation:    Pain in feet at night that wakes you up from your sleep:     Blood clot in your veins:    Leg swelling:         Pulmonary    Oxygen at home:    Productive cough:     Wheezing:         Neurologic    Sudden weakness in arms or legs:     Sudden numbness in arms or legs:     Sudden onset of difficulty speaking or slurred speech:    Temporary loss of vision in one eye:     Problems with dizziness:         Gastrointestinal    Blood in stool:     Vomited blood:         Genitourinary    Burning when urinating:     Blood in urine:        Psychiatric    Major depression:         Hematologic    Bleeding problems:    Problems with blood clotting too easily:        Skin    Rashes or ulcers:        Constitutional    Fever or  chills:      PHYSICAL EXAMINATION:  There were no vitals filed for this visit.  General:  WDWN in NAD; vital signs documented above Gait: Not observed HENT: WNL,  normocephalic Pulmonary: normal non-labored breathing , without Rales, rhonchi,  wheezing Cardiac: regular HR,  Abdomen: soft, NT, no masses Skin: without rashes Vascular Exam/Pulses:  Right Left  Radial 2+ (normal) 2+ (normal)  Ulnar 2+ (normal) 2+ (normal)  Femoral    Popliteal    DP 2+ (normal) 2+ (normal)  PT 2+ (normal) 2+ (normal)   Extremities: without ischemic changes, without Gangrene , without cellulitis; without open wounds;  There is a size discrepancy with the left arm being larger in circumference than the right.  In the forearm, the circumference in the left was 11.5 inches as opposed to 11 in the right, and in the upper arm 14.5 inches as opposed to 13.5 inches. Musculoskeletal: no muscle wasting or atrophy  Neurologic: A&O X 3;  No focal weakness or paresthesias are detected Psychiatric:  The pt has Normal affect.   Non-Invasive Vascular Imaging:   Left upper extremity venous duplex ultrasound demonstrated no deep venous thrombosis.    ASSESSMENT/PLAN: 60 y.o. female presenting with left arm swelling status post stroke.  There are no signs of arterial or venous insufficiency.  Lymphedema status post stroke can occur especially in patients with immobility.  Lymphedema is best treated with compression, physical therapy, lymphatic massage via Occupational Therapy.  I discussed the above with [facility].  And ordered her a compression sleeve for the left arm. Elea was encouraged to move the left arm is much as possible Karle was encouraged to move bilateral upper extremities is much as possible as even physical activity of the contralateral side has been proven to improve edema in the index limb. I will asked that she wear the compression during the day.  I will send a request for occupational therapy evaluation.  Unfortunately, there is not a vascular surgery intervention that will improve her lymphedema.  Yuritza can follow-up with me as needed.     Brenda John,  MD [facility] [phone]

## 2020-12-11 ENCOUNTER — Ambulatory Visit (INDEPENDENT_AMBULATORY_CARE_PROVIDER_SITE_OTHER): Payer: BC Managed Care – PPO | Admitting: Vascular Surgery

## 2020-12-11 ENCOUNTER — Other Ambulatory Visit: Payer: Self-pay

## 2020-12-11 ENCOUNTER — Encounter: Payer: Self-pay | Admitting: Vascular Surgery

## 2020-12-11 ENCOUNTER — Ambulatory Visit (HOSPITAL_COMMUNITY)
Admission: RE | Admit: 2020-12-11 | Discharge: 2020-12-11 | Disposition: A | Discharge status: Home / Self Care | Payer: BC Managed Care – PPO | Source: Ambulatory Visit | Attending: Vascular Surgery | Admitting: Vascular Surgery

## 2020-12-11 VITALS — BP 155/82 | HR 52 | Temp 98.0°F | Resp 16 | Ht 66.5 in | Wt 187.0 lb

## 2020-12-11 DIAGNOSIS — I890 Lymphedema, not elsewhere classified: Secondary | ICD-10-CM

## 2020-12-11 DIAGNOSIS — M7989 Other specified soft tissue disorders: Secondary | ICD-10-CM | POA: Diagnosis not present

## 2020-12-11 MED ORDER — TRUFORM ARM SLEEVE M 15-20MMHG MISC: 1.0000 | Freq: Every day | 0 refills | Status: AC

## 2021-01-01 ENCOUNTER — Ambulatory Visit: Payer: BC Managed Care – PPO | Admitting: Occupational Therapy

## 2021-01-07 ENCOUNTER — Emergency Department (HOSPITAL_COMMUNITY): Payer: BC Managed Care – PPO

## 2021-01-07 ENCOUNTER — Encounter (HOSPITAL_COMMUNITY): Payer: Self-pay | Admitting: *Deleted

## 2021-01-07 ENCOUNTER — Emergency Department (HOSPITAL_COMMUNITY)
Admission: EM | Admit: 2021-01-07 | Discharge: 2021-01-07 | Disposition: A | Discharge status: Home / Self Care | Payer: BC Managed Care – PPO | Attending: Emergency Medicine | Admitting: Emergency Medicine

## 2021-01-07 ENCOUNTER — Other Ambulatory Visit: Payer: Self-pay

## 2021-01-07 DIAGNOSIS — I10 Essential (primary) hypertension: Secondary | ICD-10-CM | POA: Diagnosis not present

## 2021-01-07 DIAGNOSIS — R202 Paresthesia of skin: Secondary | ICD-10-CM | POA: Diagnosis not present

## 2021-01-07 DIAGNOSIS — R531 Weakness: Secondary | ICD-10-CM | POA: Insufficient documentation

## 2021-01-07 DIAGNOSIS — R791 Abnormal coagulation profile: Secondary | ICD-10-CM | POA: Insufficient documentation

## 2021-01-07 DIAGNOSIS — Z79899 Other long term (current) drug therapy: Secondary | ICD-10-CM | POA: Insufficient documentation

## 2021-01-07 DIAGNOSIS — G319 Degenerative disease of nervous system, unspecified: Secondary | ICD-10-CM | POA: Diagnosis not present

## 2021-01-07 DIAGNOSIS — Z5321 Procedure and treatment not carried out due to patient leaving prior to being seen by health care provider: Secondary | ICD-10-CM | POA: Insufficient documentation

## 2021-01-07 DIAGNOSIS — I6381 Other cerebral infarction due to occlusion or stenosis of small artery: Secondary | ICD-10-CM | POA: Diagnosis not present

## 2021-01-07 LAB — COMPREHENSIVE METABOLIC PANEL
ALT: 16 U/L (ref 0–44)
AST: 18 U/L (ref 15–41)
Albumin: 4.1 g/dL (ref 3.5–5.0)
Alkaline Phosphatase: 100 U/L (ref 38–126)
Anion gap: 9 (ref 5–15)
BUN: 18 mg/dL (ref 6–20)
CO2: 23 mmol/L (ref 22–32)
Calcium: 9.3 mg/dL (ref 8.9–10.3)
Chloride: 109 mmol/L (ref 98–111)
Creatinine, Ser: 0.99 mg/dL (ref 0.44–1.00)
GFR, Estimated: >60 mL/min (ref >60)
Glucose, Bld: 106 mg/dL — ABNORMAL HIGH (ref 70–99)
Potassium: 3.5 mmol/L (ref 3.5–5.1)
Sodium: 141 mmol/L (ref 135–145)
Total Bilirubin: <0.1 mg/dL — ABNORMAL LOW (ref 0.3–1.2)
Total Protein: 7.8 g/dL (ref 6.5–8.1)

## 2021-01-07 LAB — CBC WITH DIFFERENTIAL/PLATELET
Abs Immature Granulocytes: 0.01 10*3/uL (ref 0.00–0.07)
Basophils Absolute: 0 10*3/uL (ref 0.0–0.1)
Basophils Relative: 1 %
Eosinophils Absolute: 0.1 10*3/uL (ref 0.0–0.5)
Eosinophils Relative: 2 %
HCT: 40 % (ref 36.0–46.0)
Hemoglobin: 12.9 g/dL (ref 12.0–15.0)
Immature Granulocytes: 0 %
Lymphocytes Relative: 40 %
Lymphs Abs: 3.1 10*3/uL (ref 0.7–4.0)
MCH: 30.1 pg (ref 26.0–34.0)
MCHC: 32.3 g/dL (ref 30.0–36.0)
MCV: 93.2 fL (ref 80.0–100.0)
Monocytes Absolute: 0.5 10*3/uL (ref 0.1–1.0)
Monocytes Relative: 7 %
Neutro Abs: 3.9 10*3/uL (ref 1.7–7.7)
Neutrophils Relative %: 50 %
Platelets: 215 10*3/uL (ref 150–400)
RBC: 4.29 MIL/uL (ref 3.87–5.11)
RDW: 13.8 % (ref 11.5–15.5)
WBC: 7.7 10*3/uL (ref 4.0–10.5)
nRBC: 0 % (ref 0.0–0.2)

## 2021-01-07 LAB — URINALYSIS, ROUTINE W REFLEX MICROSCOPIC
Bacteria, UA: NONE SEEN
Bilirubin Urine: NEGATIVE
Glucose, UA: NEGATIVE mg/dL
Hgb urine dipstick: NEGATIVE
Ketones, ur: NEGATIVE mg/dL
Leukocytes,Ua: NEGATIVE
Nitrite: NEGATIVE
Protein, ur: NEGATIVE mg/dL
Specific Gravity, Urine: 1.021 (ref 1.005–1.030)
pH: 5 (ref 5.0–8.0)

## 2021-01-07 LAB — RAPID URINE DRUG SCREEN, HOSP PERFORMED
Amphetamines: NOT DETECTED
Barbiturates: NOT DETECTED
Benzodiazepines: NOT DETECTED
Cocaine: NOT DETECTED
Opiates: NOT DETECTED
Tetrahydrocannabinol: NOT DETECTED

## 2021-01-07 LAB — ETHANOL: Alcohol, Ethyl (B): <10 mg/dL (ref <10)

## 2021-01-07 LAB — PROTIME-INR
INR: 0.9 (ref 0.8–1.2)
Prothrombin Time: 11.9 seconds (ref 11.4–15.2)

## 2021-01-07 NOTE — ED Provider Notes (Addendum)
Emergency Medicine Provider Triage Evaluation Note  [name] , a 60 y.o. female  was evaluated in triage.  Pt complains of weakness.  Since June 2022 she has had persistent left sided weakness, numbness/tingling.  She was admitted to hospital with negative work-up but OP neurologist felt she may have had a stroke.  States symptoms have never fully resolved, now on gabapentin.  States around 10PM last evening she feels like tingling worse on left side and has pain on this side as well.  Denies any changes in speech, trouble walking, blurred vision, etc.  Review of Systems  Positive: Left sided weakness and tingling x months Negative: Facial droop, confusion  Physical Exam  BP (!) 152/91 (BP Location: Left Arm)   Pulse 68   Temp 98.1 F (36.7 C) (Oral)   Resp 15   SpO2 100%   Gen:   Awake, no distress  Resp:  Normal effort  MSK:   Moves extremities without difficulty  Other:  AAOx3, moving arms and legs well, no facial droop, speech clear and goal oriented  Medical Decision Making  Medically screening exam initiated at 1:09 AM.  Appropriate orders placed.  Fiorella Hanahan was informed that the remainder of the evaluation will be completed by another provider, this initial triage assessment does not replace that evaluation, and the importance of remaining in the ED until their evaluation is complete.  Weakness, numbness/tingling on left side for the past 6 months.  Hospitalization here for same with negative work-up.  Has had OP neurology follow-up with suspect MRI negative thalamic infarct with residual symptoms.  Now has pain on left side with reported increase in tingling.  At this point, she has had prolonged symptoms and no apparent new deficits on exam.  Code stroke not activated but will proceed with stroke work-up including labs and CT head.  2:04 AM CT with findings of chronic vs late subacute right thalamic infarct but no acute findings.  Suspect this may be stroke from June that  was not seen on MRI as she has not had any further imaging since that time.  Larene Pickett, PA-C 01/07/21 0116    Larene Pickett, PA-C 01/07/21 0205    Fatima Blank, MD 01/07/21 [phone]

## 2021-01-07 NOTE — ED Notes (Addendum)
Pt asked for an update. Pt informed that we have all her results and that we are just waiting on a room. Pt informed the staff that she will be going home and is calling her son to come and get her. Pt encouraged to stay but refused.

## 2021-01-07 NOTE — ED Triage Notes (Signed)
The ot is c/o lt sided weakness since yesterday around 1000  worse Saturday  she is c/o numbness and  tingling with pain in her lt arm

## 2021-01-08 ENCOUNTER — Ambulatory Visit: Payer: BC Managed Care – PPO | Attending: Vascular Surgery | Admitting: Occupational Therapy

## 2021-01-08 ENCOUNTER — Encounter: Payer: Self-pay | Admitting: Occupational Therapy

## 2021-01-08 DIAGNOSIS — R600 Localized edema: Secondary | ICD-10-CM | POA: Insufficient documentation

## 2021-01-08 DIAGNOSIS — M6281 Muscle weakness (generalized): Secondary | ICD-10-CM | POA: Insufficient documentation

## 2021-01-08 DIAGNOSIS — R208 Other disturbances of skin sensation: Secondary | ICD-10-CM | POA: Diagnosis not present

## 2021-01-08 DIAGNOSIS — M79602 Pain in left arm: Secondary | ICD-10-CM | POA: Diagnosis not present

## 2021-01-08 DIAGNOSIS — I890 Lymphedema, not elsewhere classified: Secondary | ICD-10-CM | POA: Diagnosis not present

## 2021-01-08 NOTE — Therapy (Signed)
[name] [address], [place], [address] Phone: [phone]   Fax:  [phone]  Occupational Therapy Evaluation  Patient Details  Name: [name] MRN: [id number] Date of Birth: March 15, 1960 Referring Provider (OT): Dr Virl Cagey   Encounter Date: 01/08/2021   OT End of Session - 01/08/21 1924     Visit Number 1    Number of Visits 6    Date for OT Re-Evaluation 02/19/21    OT Start Time 1030    OT Stop Time 1125    OT Time Calculation (min) 55 min    Activity Tolerance Patient tolerated treatment well    Behavior During Therapy [name] for tasks assessed/performed             Past Medical History:  Diagnosis Date   Anal fissure    Anxiety    Chest pain    r/t anxiety/panic attacks   Diverticulitis    Diverticulosis    Dizziness    Hx in past - now resolved   GERD (gastroesophageal reflux disease)    diet controlled, no med   HA (headache)    Hyperlipidemia    diet controlled, no med   Hypertension    IBS (irritable bowel syndrome)    SOB (shortness of breath)    on occasion - resolved per patient    Past Surgical History:  Procedure Laterality Date   ABDOMINAL HYSTERECTOMY     ovaries remain   APPENDECTOMY     BREAST EXCISIONAL BIOPSY Left 2006   BREAST SURGERY     COLONOSCOPY     HIP SURGERY     LAPAROSCOPY     NECK SURGERY     cyst removed from neck   RECTAL SURGERY     thromosis/fissure   TONSILECTOMY, ADENOIDECTOMY, BILATERAL MYRINGOTOMY AND TUBES      There were no vitals filed for this visit.   Subjective Assessment - 01/08/21 1912     Subjective  MY arm had been bothering me since my stroke and not getting better- the swelling started about 6 wks after my stroke-all my fingers are numb feeling and pain can be 8/10 from my  shoulde downe the front of my L arm to elbow - feels heavy, tight , full -have appt for my compression sleeve after I see you    Pertinent History  [name] is a 60 y.o. ([birth year]) female who presents at the request of Brenda Seashore, MD for evaluation of left arm swelling.     In June of this year, the patient had a stroke resulting in left-sided deficits.  She continues to have sequela including numbness and tingling in the left arm and leg.  Fortunately, her motor function has improved, now with only slight weakness appreciated on strength examination.   For the last several months, she is also appreciated left arm swelling, and heaviness.  Per [name], her skin feels tight, especially with flexion at the elbow.  She denies increased pain with use, rest pain, ulceration.  She has no history of DVT.  No family history of lymphedema.  No history of breast cancer, most recent mammogram normal.      [name] has returned to work, and since most of her day sitting and typing.  While the heaviness is bothersome, she is still able to feel her occupational needs.  [name] has not attempted compression, no previous vein procedures.   Refer to OT for L UE lymphedema and  recommended compression    Patient Stated Goals I want the swelling , numnbess, pain better in my L arm  so I can ue it  to drive,type on computer    Currently in Pain? Yes    Pain Score 8     Pain Location Arm    Pain Orientation Left    Pain Descriptors / Indicators Aching;Numbness;Heaviness;Tightness    Pain Type Acute pain;Chronic pain    Pain Onset More than a month ago    Pain Frequency Constant               [facility] OT Assessment - 01/08/21 0001       Assessment   Medical Diagnosis L UE lymphedema, numbness, pain    Referring Provider (OT) Dr Virl Cagey    Onset Date/Surgical Date 07/05/20    Hand Dominance Right    Next MD Visit Febr Neurology      Home  Environment   Lives With --   adult son lives with her     Prior Function   Vocation Full time employment    Leisure Work from home on computer for [employer]- likes to travel, drive- do own house work      AROM    Overall AROM Comments L shoulder AROM WFL - but weak and heavy feeling, fatigue    Left Elbow Flexion --   pain - tenderness over anterior shoulder and anterior elbow - at origin and insertion-   Left Elbow Extension --   tight end range and pull over distal bicep   Left Forearm Supination --   pain with supination     Strength   Right Hand Grip (lbs) 54    Right Hand Lateral Pinch 15 lbs    Right Hand 3 Point Pinch 15 lbs    Left Hand Grip (lbs) 32    Left Hand Lateral Pinch 12 lbs    Left Hand 3 Point Pinch 7 lbs              LYMPHEDEMA/ONCOLOGY QUESTIONNAIRE - 01/08/21 0001       Right Upper Extremity Lymphedema   15 cm Proximal to Olecranon Process 37 cm    10 cm Proximal to Olecranon Process 34.5 cm    Olecranon Process 27.8 cm    15 cm Proximal to Ulnar Styloid Process 27 cm    10 cm Proximal to Ulnar Styloid Process 22.8 cm    Just Proximal to Ulnar Styloid Process 15.8 cm    Across Hand at [employer] 20 cm    At [place] of 2nd Digit 6.4 cm    At [facility] of Thumb 6.4 cm      Left Upper Extremity Lymphedema   15 cm Proximal to Olecranon Process 37 cm    10 cm Proximal to Olecranon Process 35 cm    Olecranon Process 29 cm    15 cm Proximal to Ulnar Styloid Process 27.5 cm    10 cm Proximal to Ulnar Styloid Process 23 cm    Just Proximal to Ulnar Styloid Process 16.4 cm    Across Hand at [employer] 19.4 cm    At [place] of 2nd Digit 6.1 cm    At [facility] of Thumb 6.1 cm                            OT Education - 01/08/21 1924     Education Details findings of  eval and HEP    Person(s) Educated Patient    Methods Explanation;Demonstration;Tactile cues;Verbal cues;Handout    Comprehension Verbal cues required;Returned demonstration;Verbalized understanding                 OT Long Term Goals - 01/08/21 1949       OT LONG TERM GOAL #1   Title Pt report  L UE pain decrease from 8/10 to less than 3/10 with AROM and use during day in  ADL's    Baseline pain at rest and use 8/10 -shoulder to elbow    Time 4    Period Weeks    Status New    Target Date 02/05/21      OT LONG TERM GOAL #2   Title Pt independent in wearing of compression on L UE to decrease heaviness, pain symptoms    Baseline constant heavy, numbness , pain 8/10 i nL UE -    Time 3    Period Weeks    Status New    Target Date 01/29/21      OT LONG TERM GOAL #3   Title Pt to be independent in HEP for strengthening to report decrease heaviness and pain in L UE with ADL's and use during ay at home    Baseline heaviness, pain , numbness in L UE constarnt - increase edema from mid upper arm to distal to elbow , and wrist    Time 6    Period Weeks    Status New    Target Date 02/19/21      OT LONG TERM GOAL #4   Title Assess numbness in L hand and arm - to improve for pt to palpate textures and keys on computer    Baseline to be asess next viist    Time 1    Period Weeks    Status New    Target Date 01/15/21                   Plan - 01/08/21 1928     Clinical Impression Statement Pt refer to OT by [facility]acular - seen in early Nov 22 after pt had In June of this year a stroke resulting in left-sided deficits.  She  had numbness and tingling in the left arm and leg.  Fortunately, her motor function has improved.. She report the last several months  increase left arm swelling, and heaviness.  Per [place], her skin feels tight, especially with flexion at the elbow.  She do report pain over anterior shoulder, down anterior upper arm to elbow. That increase with use. She has no history of DVT.  No family history of lymphedema.  No history of breast cancer, most recent mammogram normal. She has returned to work, and since most of her day sitting and typing.  While the heaviness is bothersome, she is still able to feel her occupational needs.Pt AROM in L UE WNL but fatigue more than strength- decrease over head strength in shoulder , and pain over anterior  shoulder and elbow - ? tendinitis of bicep- guarding end range elbow extention and pain with supination. Grip strength and 3 point pinch decrease in L compare to L for her age. Circumference of L UE increase 1.2 cm at elbow, 0.5 at mid upper arm, proximal to elbow and wrist. Sensation will be assess next visit. Pt was fitted with temporary compression for L UE to sleep with - isonter glove , tubigip D and E -and then will be  fit fomr over the counter compression sleeve and glove for daytime use. Pt to do some contrast and ice over anterior shoulder and elbow and keep L UE shoulder AROM pain free and avoid carrying or lifting with bicep- pt pain , edema , numbness and decrease AROM end range and strength overhead -limitng her functional use of L UE in ADL's and IADL's - pt can benefit from skilled OT services. Did notice  after pt left , that she had shot in Oct 21 for L shoulder impingement .    OT Occupational Profile and History Problem Focused Assessment - Including review of records relating to presenting problem    Occupational performance deficits (Please refer to evaluation for details): ADL's;IADL's;Rest and Sleep;Work;Play;Leisure;Social Participation    Body Structure / Function / Physical Skills ADL;Sensation;Fascial restriction;ROM;IADL;Edema;UE functional use;Pain;Strength    Rehab Potential Fair    Clinical Decision Making Limited treatment options, no task modification necessary    Comorbidities Affecting Occupational Performance: None    Modification or Assistance to Complete Evaluation  No modification of tasks or assist necessary to complete eval    OT Frequency 1x / week    OT Duration 6 weeks    OT Treatment/Interventions Self-care/ADL training;Ultrasound;Contrast Bath;Therapeutic exercise;Manual Therapy;Patient/family education;Compression bandaging;Manual lymph drainage    Consulted and Agree with Plan of Care Patient             Patient will benefit from skilled therapeutic  intervention in order to improve the following deficits and impairments:   Body Structure / Function / Physical Skills: ADL, Sensation, Fascial restriction, ROM, IADL, Edema, UE functional use, Pain, Strength       Visit Diagnosis: Localized edema - Plan: Ot plan of care cert/re-cert  Pain in left arm - Plan: Ot plan of care cert/re-cert  Muscle weakness (generalized) - Plan: Ot plan of care cert/re-cert  Other disturbances of skin sensation - Plan: Ot plan of care cert/re-cert    Problem List Patient Active Problem List   Diagnosis Date Noted   Paresthesia 08/01/2020   TIA (transient ischemic attack) 08/01/2020   Pain of right hip joint 01/20/2017   Right hip pain 12/30/2016   Dizziness    HA (headache)    HYPERLIPIDEMIA 10/04/2009   HYPERGLYCEMIA 06/28/2009   Essential hypertension 10/14/2006   Headache(784.0) 10/14/2006   Overweight(278.02) 02/04/2006    Rosalyn Gess, OTR/L,CLT 01/08/2021, 7:55 PM  [facility] [address] [place], [address] Phone: [phone]   Fax:  [phone]  Name: [name] MRN: [id number] Date of Birth: Oct 06, 1960

## 2021-01-12 ENCOUNTER — Ambulatory Visit: Payer: BC Managed Care – PPO | Admitting: Occupational Therapy

## 2021-01-12 ENCOUNTER — Other Ambulatory Visit: Payer: Self-pay

## 2021-01-12 DIAGNOSIS — M6281 Muscle weakness (generalized): Secondary | ICD-10-CM | POA: Diagnosis not present

## 2021-01-12 DIAGNOSIS — R208 Other disturbances of skin sensation: Secondary | ICD-10-CM

## 2021-01-12 DIAGNOSIS — R600 Localized edema: Secondary | ICD-10-CM

## 2021-01-12 DIAGNOSIS — M79602 Pain in left arm: Secondary | ICD-10-CM

## 2021-01-12 NOTE — Therapy (Signed)
[facility] [address], [place], [address] Phone: [phone]   Fax:  [phone]  Occupational Therapy Treatment  Patient Details  Name: [name] MRN: [id number] Date of Birth: 1960/11/26 Referring Provider (OT): Dr Virl Cagey   Encounter Date: 01/12/2021   OT End of Session - 01/12/21 1145     Visit Number 2    Number of Visits 6    Date for OT Re-Evaluation 02/19/21    OT Start Time 0915    OT Stop Time 1003    OT Time Calculation (min) 48 min    Activity Tolerance Patient tolerated treatment well    Behavior During Therapy [facility] for tasks assessed/performed             Past Medical History:  Diagnosis Date   Anal fissure    Anxiety    Chest pain    r/t anxiety/panic attacks   Diverticulitis    Diverticulosis    Dizziness    Hx in past - now resolved   GERD (gastroesophageal reflux disease)    diet controlled, no med   HA (headache)    Hyperlipidemia    diet controlled, no med   Hypertension    IBS (irritable bowel syndrome)    SOB (shortness of breath)    on occasion - resolved per patient    Past Surgical History:  Procedure Laterality Date   ABDOMINAL HYSTERECTOMY     ovaries remain   APPENDECTOMY     BREAST EXCISIONAL BIOPSY Left 2006   BREAST SURGERY     COLONOSCOPY     HIP SURGERY     LAPAROSCOPY     NECK SURGERY     cyst removed from neck   RECTAL SURGERY     thromosis/fissure   TONSILECTOMY, ADENOIDECTOMY, BILATERAL MYRINGOTOMY AND TUBES      There were no vitals filed for this visit.   Subjective Assessment - 01/12/21 1141     Subjective  I did pick up the compression sleeve that the doctor ordered and glove -but my arm hurt so bad yesterday and my finger tips feels funny - took it off after about 5 hours on the computer- did sleep with the light compression you gave me - my arm about 5/10 now - but yesterday with the sleeve was 9/10    Pertinent History [name] is a 60 y.o. (10/09/1960) female who presents at the request of Merrilee Seashore, MD for evaluation of left arm swelling.     In June of this year, the patient had a stroke resulting in left-sided deficits.  She continues to have sequela including numbness and tingling in the left arm and leg.  Fortunately, her motor function has improved, now with only slight weakness appreciated on strength examination.   For the last several months, she is also appreciated left arm swelling, and heaviness.  Per [place], her skin feels tight, especially with flexion at the elbow.  She denies increased pain with use, rest pain, ulceration.  She has no history of DVT.  No family history of lymphedema.  No history of breast cancer, most recent mammogram normal.      [name] has returned to work, and since most of her day sitting and typing.  While the heaviness is bothersome, she is still able to feel her occupational needs.  [name] has not attempted compression, no previous vein procedures.   Refer to OT for L UE lymphedema and recommended  compression    Patient Stated Goals I want the swelling , numnbess, pain better in my L arm  so I can ue it  to drive,type on computer    Currently in Pain? Yes    Pain Score 5     Pain Location Arm    Pain Orientation Left    Pain Descriptors / Indicators Numbness;Heaviness;Aching;Tightness    Pain Type Acute pain;Chronic pain    Pain Onset More than a month ago                 LYMPHEDEMA/ONCOLOGY QUESTIONNAIRE - 01/12/21 0001       Left Upper Extremity Lymphedema   15 cm Proximal to Olecranon Process 37 cm    10 cm Proximal to Olecranon Process 35.5 cm    Olecranon Process 28.4 cm    15 cm Proximal to Ulnar Styloid Process 27.4 cm    10 cm Proximal to Ulnar Styloid Process 22.5 cm    Just Proximal to Ulnar Styloid Process 15.8 cm    Across Hand at [employer] 19.4 cm    At [place] of 2nd Digit 6 cm    At [facility] of Thumb 6 cm               Pt arrive  with [name] over the counter sleeve and glove-pt report her arm was hurting more with sleeve on - working on [employer] it on for about 5 hrs- order per vascular MD  Sleep with isotoner glove, tubigrip D and F - doing okay  Finger tips feels funny  Pt do have still numbness in arm since stroke Assess Semmes weinstein today - 4.31 on thumb  4.56 on 4th digit and thumb - and volar upper arm -and light on deltoid but not forearm and palm   DOne contrast supine with arm out to about 60 degrees abd  Light graston over upperarm and anterior shoulder cross friction massage and myofascial over volar upper arm and elbow in supine  US done to anterior shoulder - pulse [place] and 3.3 MHZ- 20% , 1.0 intensity 4 min   SUpine - shoulder protraction -or punches- 2 x 10 reps Per pt med to hard  YTB for gentle retraction for scapula 2 x 10 reps  2 x day  Pain free - not over use bicep to lift , carry or twist               OT Education - 01/12/21 1145     Education Details progress and HEP    Person(s) Educated Patient    Methods Explanation;Demonstration;Tactile cues;Verbal cues;Handout    Comprehension Verbal cues required;Returned demonstration;Verbalized understanding                 OT Long Term Goals - 01/08/21 1949       OT LONG TERM GOAL #1   Title Pt report  L UE pain decrease from 8/10 to less than 3/10 with AROM and use during day in ADL's    Baseline pain at rest and use 8/10 -shoulder to elbow    Time 4    Period Weeks    Status New    Target Date 02/05/21      OT LONG TERM GOAL #2   Title Pt independent in wearing of compression on L UE to decrease heaviness, pain symptoms    Baseline constant heavy, numbness , pain 8/10 i nL UE -    Time 3  Period Weeks    Status New    Target Date 01/29/21      OT LONG TERM GOAL #3   Title Pt to be independent in HEP for strengthening to report decrease heaviness and pain in L UE with ADL's and use during ay at  home    Baseline heaviness, pain , numbness in L UE constarnt - increase edema from mid upper arm to distal to elbow , and wrist    Time 6    Period Weeks    Status New    Target Date 02/19/21      OT LONG TERM GOAL #4   Title Assess numbness in L hand and arm - to improve for pt to palpate textures and keys on computer    Baseline to be asess next viist    Time 1    Period Weeks    Status New    Target Date 01/15/21                   Plan - 01/12/21 1149     Clinical Impression Statement Pt refer to OT by Vascular - seen in early Nov 22 after pt had In June of this year a stroke resulting in left-sided deficits.  She  had numbness and tingling in the left arm and leg.  Fortunately, her motor function has improved.. She report the last several months  increase left arm swelling, and heaviness.  Per [place], her skin feels tight, especially with flexion and extention at the elbow.  She do report pain over anterior shoulder, down anterior upper arm to elbow. That increase with use. She has no history of DVT.  No family history of lymphedema.  No history of breast cancer, most recent mammogram normal. She has returned to work, and since most of her day sitting and typing.  While the heaviness is bothersome, she is still able to feel her occupational needs.Pt AROM in L UE WNL but fatigue more than strength- decrease over head strength in shoulder , and pain over anterior shoulder and elbow - ? tendinitis of bicep- guarding end range elbow extention and pain with supination. Grip strength and 3 point pinch decrease in L compare to L for her age. [place]renss over anterior shoulder. Circumference of L UE was  increased at eval by  1.2 cm at elbow, 0.5 at mid upper arm, proximal to elbow and wrist.  Pt was fitted at eval with temporary compression for L UE to sleep with - isonter glove , tubigip D and E -and then since eval attempt to wear the over the counter compression sleeve and glove for  daytime but only can wear it for 5 hrs -then arm painfull and finger tips feels funny. Pt was ed on donning compression sleeve correctly to decrease shoulder and arm discomfort - pt weak proximal and scapula stablitization -add supine punches and shoulder retraction this date with YTB. Pt report med to hard the HEP. Pt to cont with contrast and ice over anterior shoulder and elbow and keep L UE shoulder AROM pain free and avoid carrying or lifting with bicep- pt pain , edema , numbness and decrease AROM end range and strength overhead -limitng her functional use of L UE in ADL's and IADL's - pt can benefit from skilled OT services. Did notice  after pt left , that she had shot in Oct 21 for L shoulder impingement .    OT Occupational Profile and History Problem Focused Assessment -  Including review of records relating to presenting problem    Occupational performance deficits (Please refer to evaluation for details): ADL's;IADL's;Rest and Sleep;Work;Play;Leisure;Social Participation    Body Structure / Function / Physical Skills ADL;Sensation;Fascial restriction;ROM;IADL;Edema;UE functional use;Pain;Strength    Rehab Potential Fair    Clinical Decision Making Limited treatment options, no task modification necessary    Comorbidities Affecting Occupational Performance: None    Modification or Assistance to Complete Evaluation  No modification of tasks or assist necessary to complete eval    OT Frequency 1x / week    OT Duration 6 weeks    OT Treatment/Interventions Self-care/ADL training;Ultrasound;Contrast Bath;Therapeutic exercise;Manual Therapy;Patient/family education;Compression bandaging;Manual lymph drainage    Consulted and Agree with Plan of Care Patient             Patient will benefit from skilled therapeutic intervention in order to improve the following deficits and impairments:   Body Structure / Function / Physical Skills: ADL, Sensation, Fascial restriction, ROM, IADL, Edema, UE  functional use, Pain, Strength       Visit Diagnosis: Localized edema  Pain in left arm  Muscle weakness (generalized)  Other disturbances of skin sensation    Problem List Patient Active Problem List   Diagnosis Date Noted   Paresthesia 08/01/2020   TIA (transient ischemic attack) 08/01/2020   Pain of right hip joint 01/20/2017   Right hip pain 12/30/2016   Dizziness    HA (headache)    HYPERLIPIDEMIA 10/04/2009   HYPERGLYCEMIA 06/28/2009   Essential hypertension 10/14/2006   Headache(784.0) 10/14/2006   Overweight(278.02) 02/04/2006    [name], OTR/L,CLT 01/12/2021, 11:55 AM  [facility] [address] [place], [address] Phone: [phone]   Fax:  [phone]  Name: [name] MRN: [id number] Date of Birth: [birth year]

## 2021-01-15 ENCOUNTER — Ambulatory Visit: Payer: BC Managed Care – PPO | Admitting: Occupational Therapy

## 2021-01-15 DIAGNOSIS — M79602 Pain in left arm: Secondary | ICD-10-CM | POA: Diagnosis not present

## 2021-01-15 DIAGNOSIS — R208 Other disturbances of skin sensation: Secondary | ICD-10-CM | POA: Diagnosis not present

## 2021-01-15 DIAGNOSIS — M6281 Muscle weakness (generalized): Secondary | ICD-10-CM | POA: Diagnosis not present

## 2021-01-15 DIAGNOSIS — R600 Localized edema: Secondary | ICD-10-CM

## 2021-01-15 NOTE — Therapy (Signed)
[facility] [address], [place], [address] Phone: [phone]   Fax:  [phone]  Occupational Therapy Treatment  Patient Details  Name: [name] MRN: [id number] Date of Birth: [birth year] Referring Provider (OT): Dr Virl Cagey   Encounter Date: 01/15/2021   OT End of Session - 01/15/21 1056     Visit Number 3    Number of Visits 6    Date for OT Re-Evaluation 02/19/21    OT Start Time 0945    OT Stop Time 1038    OT Time Calculation (min) 53 min    Activity Tolerance Patient tolerated treatment well    Behavior During Therapy [facility] for tasks assessed/performed             Past Medical History:  Diagnosis Date   Anal fissure    Anxiety    Chest pain    r/t anxiety/panic attacks   Diverticulitis    Diverticulosis    Dizziness    Hx in past - now resolved   GERD (gastroesophageal reflux disease)    diet controlled, no med   HA (headache)    Hyperlipidemia    diet controlled, no med   Hypertension    IBS (irritable bowel syndrome)    SOB (shortness of breath)    on occasion - resolved per patient    Past Surgical History:  Procedure Laterality Date   ABDOMINAL HYSTERECTOMY     ovaries remain   APPENDECTOMY     BREAST EXCISIONAL BIOPSY Left 2006   BREAST SURGERY     COLONOSCOPY     HIP SURGERY     LAPAROSCOPY     NECK SURGERY     cyst removed from neck   RECTAL SURGERY     thromosis/fissure   TONSILECTOMY, ADENOIDECTOMY, BILATERAL MYRINGOTOMY AND TUBES      There were no vitals filed for this visit.   Subjective Assessment - 01/15/21 1054     Subjective  I did okay Friday after I seen you -but Sat night it hurt some and yesterday -took someTylenol yesterday -my fingers just feel funny - I did do my bands and the other exercise    Pertinent History [name] is a 60 y.o. (1960-12-10) female who presents at the request of Merrilee Seashore, MD for evaluation of left arm  swelling.     In June of this year, the patient had a stroke resulting in left-sided deficits.  She continues to have sequela including numbness and tingling in the left arm and leg.  Fortunately, her motor function has improved, now with only slight weakness appreciated on strength examination.   For the last several months, she is also appreciated left arm swelling, and heaviness.  Per [place], her skin feels tight, especially with flexion at the elbow.  She denies increased pain with use, rest pain, ulceration.  She has no history of DVT.  No family history of lymphedema.  No history of breast cancer, most recent mammogram normal.      [name] has returned to work, and since most of her day sitting and typing.  While the heaviness is bothersome, she is still able to feel her occupational needs.  [name] has not attempted compression, no previous vein procedures.   Refer to OT for L UE lymphedema and recommended compression    Patient Stated Goals I want the swelling , numnbess, pain better in my L arm  so I can ue it  to drive,type on computer    Currently in Pain? Yes    Pain Score 4     Pain Location Arm    Pain Orientation Left    Pain Descriptors / Indicators Tightness;Numbness;Aching;Heaviness    Pain Type Acute pain;Chronic pain    Pain Onset More than a month ago                 LYMPHEDEMA/ONCOLOGY QUESTIONNAIRE - 01/15/21 0001       Left Upper Extremity Lymphedema   15 cm Proximal to Olecranon Process 36.5 cm    10 cm Proximal to Olecranon Process 35 cm    Olecranon Process 28 cm    15 cm Proximal to Ulnar Styloid Process 27.4 cm    10 cm Proximal to Ulnar Styloid Process 23 cm    Just Proximal to Ulnar Styloid Process 16 cm    Across Hand at [employer] 19 cm               Pt arrive with [name] over the counter sleeve on this date but not the glove- report finger tips felt funny -and took the glove off   Compression was order ed by vascular MD   Did sleep with  tubigrip D and F - but also not with isotoner glove  Finger tips feels funny per pt -  Pt do have still numbness in arm since stroke Assess Semmes weinstein last week- 4.31 on thumb  4.56 on 4th digit and thumb - and volar upper arm -and light on deltoid but not forearm and palm  ? More sensory issue than circulation - circumference did decrease since eval - at wrist , elbow and prox/distal to elbow   DOne heat supine on L arm out to about 60 degrees abd -and done some gentle AAROM for ABD in supine after heat Light mini massager done over upperarm and anterior shoulder cross friction massage and myofascial over volar upper arm and elbow in supine - knot on distal bicep compare to the R  US done to anterior shoulder  and distal bicep at end of session - pulse [place] and 3.3 MHZ- 20% , 1.0 intensity 7 min    SUpine - shoulder protraction -or punches- 2 x 10 reps Needed review - pt was doing it in sitting at home  Per pt medium effort this date  YTB for gentle retraction for scapula 2 x 10 reps  2 x day  And shoulder extention add this date - 2 x 10 reps 1-2 x day  Pain free - not over use bicep to lift , carry or twist And can do gentle scapula retraction 3   x day -when going to bathroom - at doorway  Pt to ask pharmacist or MD on the 19th about pain medication she can use that has anti inflammatory for bicep She is taking Tylenol at the moment On 2 BP meds since stroke and aspirin               OT Education - 01/15/21 1056     Education Details progress and HEP    Person(s) Educated Patient    Methods Explanation;Demonstration;Tactile cues;Verbal cues;Handout    Comprehension Verbal cues required;Returned demonstration;Verbalized understanding                 OT Long Term Goals - 01/08/21 1949       OT LONG TERM GOAL #1   Title Pt report  L UE pain  decrease from 8/10 to less than 3/10 with AROM and use during day in ADL's    Baseline pain at rest and use 8/10  -shoulder to elbow    Time 4    Period Weeks    Status New    Target Date 02/05/21      OT LONG TERM GOAL #2   Title Pt independent in wearing of compression on L UE to decrease heaviness, pain symptoms    Baseline constant heavy, numbness , pain 8/10 i nL UE -    Time 3    Period Weeks    Status New    Target Date 01/29/21      OT LONG TERM GOAL #3   Title Pt to be independent in HEP for strengthening to report decrease heaviness and pain in L UE with ADL's and use during ay at home    Baseline heaviness, pain , numbness in L UE constarnt - increase edema from mid upper arm to distal to elbow , and wrist    Time 6    Period Weeks    Status New    Target Date 02/19/21      OT LONG TERM GOAL #4   Title Assess numbness in L hand and arm - to improve for pt to palpate textures and keys on computer    Baseline to be asess next viist    Time 1    Period Weeks    Status New    Target Date 01/15/21                   Plan - 01/15/21 1057     OT Occupational Profile and History Problem Focused Assessment - Including review of records relating to presenting problem    Occupational performance deficits (Please refer to evaluation for details): ADL's;IADL's;Rest and Sleep;Work;Play;Leisure;Social Participation    Body Structure / Function / Physical Skills ADL;Sensation;Fascial restriction;ROM;IADL;Edema;UE functional use;Pain;Strength    Rehab Potential Fair    Clinical Decision Making Limited treatment options, no task modification necessary    Comorbidities Affecting Occupational Performance: None    Modification or Assistance to Complete Evaluation  No modification of tasks or assist necessary to complete eval    OT Frequency 1x / week    OT Duration 6 weeks    OT Treatment/Interventions Self-care/ADL training;Ultrasound;Contrast Bath;Therapeutic exercise;Manual Therapy;Patient/family education;Compression bandaging;Manual lymph drainage    Consulted and Agree with Plan  of Care Patient             Patient will benefit from skilled therapeutic intervention in order to improve the following deficits and impairments:   Body Structure / Function / Physical Skills: ADL, Sensation, Fascial restriction, ROM, IADL, Edema, UE functional use, Pain, Strength       Visit Diagnosis: Localized edema  Pain in left arm  Muscle weakness (generalized)  Other disturbances of skin sensation    Problem List Patient Active Problem List   Diagnosis Date Noted   Paresthesia 08/01/2020   TIA (transient ischemic attack) 08/01/2020   Pain of right hip joint 01/20/2017   Right hip pain 12/30/2016   Dizziness    HA (headache)    HYPERLIPIDEMIA 10/04/2009   HYPERGLYCEMIA 06/28/2009   Essential hypertension 10/14/2006   Headache(784.0) 10/14/2006   Overweight(278.02) 02/04/2006    [name], [name],[name] 01/15/2021, 10:59 AM  [facility] [address] [place], [address] Phone: [phone]   Fax:  [phone]  Name: Brenda  Melendez MRN: [id number] Date of Birth: 1960/05/16

## 2021-01-22 ENCOUNTER — Ambulatory Visit: Payer: BC Managed Care – PPO | Admitting: Occupational Therapy

## 2021-01-22 DIAGNOSIS — I890 Lymphedema, not elsewhere classified: Secondary | ICD-10-CM | POA: Diagnosis not present

## 2021-01-22 DIAGNOSIS — R208 Other disturbances of skin sensation: Secondary | ICD-10-CM | POA: Diagnosis not present

## 2021-01-22 DIAGNOSIS — M6281 Muscle weakness (generalized): Secondary | ICD-10-CM

## 2021-01-22 DIAGNOSIS — M79602 Pain in left arm: Secondary | ICD-10-CM | POA: Diagnosis not present

## 2021-01-22 DIAGNOSIS — R600 Localized edema: Secondary | ICD-10-CM

## 2021-01-22 DIAGNOSIS — G8194 Hemiplegia, unspecified affecting left nondominant side: Secondary | ICD-10-CM | POA: Diagnosis not present

## 2021-01-22 DIAGNOSIS — M7989 Other specified soft tissue disorders: Secondary | ICD-10-CM | POA: Diagnosis not present

## 2021-01-22 DIAGNOSIS — I10 Essential (primary) hypertension: Secondary | ICD-10-CM | POA: Diagnosis not present

## 2021-01-25 ENCOUNTER — Encounter: Payer: Self-pay | Admitting: Occupational Therapy

## 2021-01-25 NOTE — Therapy (Signed)
[place] [facility] [address] [place], [place], [address] Phone: [phone]   Fax:  [phone]  Occupational Therapy Treatment  Patient Details  Name: [name] MRN: [id number] Date of Birth: 29-Aug-1960 Referring Provider (OT): Dr Karin Lieu   Encounter Date: 01/22/2021   OT End of Session - 01/25/21 0824     Visit Number 4    Number of Visits 6    Date for OT Re-Evaluation 02/19/21    OT Start Time 0900    OT Stop Time 0947    OT Time Calculation (min) 47 min    Activity Tolerance Patient tolerated treatment well    Behavior During Therapy [facility] for tasks assessed/performed             Past Medical History:  Diagnosis Date   Anal fissure    Anxiety    Chest pain    r/t anxiety/panic attacks   Diverticulitis    Diverticulosis    Dizziness    Hx in past - now resolved   GERD (gastroesophageal reflux disease)    diet controlled, no med   HA (headache)    Hyperlipidemia    diet controlled, no med   Hypertension    IBS (irritable bowel syndrome)    SOB (shortness of breath)    on occasion - resolved per patient    Past Surgical History:  Procedure Laterality Date   ABDOMINAL HYSTERECTOMY     ovaries remain   APPENDECTOMY     BREAST EXCISIONAL BIOPSY Left 2006   BREAST SURGERY     COLONOSCOPY     HIP SURGERY     LAPAROSCOPY     NECK SURGERY     cyst removed from neck   RECTAL SURGERY     thromosis/fissure   TONSILECTOMY, ADENOIDECTOMY, BILATERAL MYRINGOTOMY AND TUBES      There were no vitals filed for this visit.   Subjective Assessment - 01/24/21 0822     Subjective  Pt reports she is still has issues with pain in the arm, fingers still feel funny, swelling.  Pt reports she has not worn compression glove/gauntlet since it is uncomfortable.    Pertinent History [name] is a 60 y.o. (November 13, 1960) female who presents at the request of Georgianne Fick, MD for evaluation of left arm  swelling.     In June of this year, the patient had a stroke resulting in left-sided deficits.  She continues to have sequela including numbness and tingling in the left arm and leg.  Fortunately, her motor function has improved, now with only slight weakness appreciated on strength examination.   For the last several months, she is also appreciated left arm swelling, and heaviness.  Per [place], her skin feels tight, especially with flexion at the elbow.  She denies increased pain with use, rest pain, ulceration.  She has no history of DVT.  No family history of lymphedema.  No history of breast cancer, most recent mammogram normal.      Alayia has returned to work, and since most of her day sitting and typing.  While the heaviness is bothersome, she is still able to feel her occupational needs.  Arline Asp has not attempted compression, no previous vein procedures.   Refer to OT for L UE lymphedema and recommended compression    Patient Stated Goals I want the swelling , numnbess, pain better in my L arm  so I can ue it  to drive,type on computer  Currently in Pain? Yes    Pain Score 4     Pain Location Arm    Pain Orientation Left    Pain Descriptors / Indicators Aching;Numbness    Pain Type Acute pain    Pain Onset More than a month ago    Pain Frequency Intermittent             Discussed edema management in LUE, pt wearing compression sleeve at times but not compression glove/gauntlet.  She also has an Building surveyor and encouraged her to wear the isotoner to help decrease any edema in the hand.  Circumferential measurements taken of the left upper extremity from the digits to the armpit, see flowsheet for details.  With comparison to right upper extremity patient demonstrates mild edema in left upper extremity.  Manual lymphatic drainage techniques applied to left upper extremity and associated pathways as per clinical protocol to help manage edema in this area.   Palpation of bicep tendon  this date does not reveal tenderness in this area however she does still have some pain and numbness throughout the arm.  Manual skills utilized to pectoralis area both major and minor to increase tissue mobility and promote greater range of motion.   Range of motion to left upper extremity all joints and planes within available range and pain tolerance.  Reviewed home exercise program to include yellow Thera-Band exercises for scapular retraction, shoulder extension.   Will monitor patient's progress and potentially teach her manual lymphatic drainage techniques for home to help manage fluctuations in edema of the left upper extremity.  Will also utilize ultrasound if patient has tenderness in the bicep area.  Response to treatment: Patient with decreased pain at the biceps tendon however still has pain in the arm and numbness towards the left hand.  Responded well to techniques for manual lymphatic drainage as well as therapeutic exercises in the clinic.  Continue to monitor pain patterns, range of motion and edema and adjust treatment accordingly.  We will consider teaching patient self-directed manual lymphatic drainage techniques in the next 1-2 sessions.  Continue towards goals and plan of care to maximize safety and independence in daily tasks.    LYMPHEDEMA/ONCOLOGY QUESTIONNAIRE - 01/24/21 0854       Left Upper Extremity Lymphedema   10 cm Proximal to Olecranon Process 36 cm    Olecranon Process 29.8 cm    15 cm Proximal to Ulnar Styloid Process 28.5 cm    10 cm Proximal to Ulnar Styloid Process 24 cm    Just Proximal to Ulnar Styloid Process 16.3 cm    Across Hand at [facility] 19.3 cm    At [place] of 2nd Digit 6.3 cm    At [facility] of Thumb 6.1 cm                             OT Education - 01/25/21 0824     Education Details HEP    Person(s) Educated Patient    Methods Explanation;Demonstration;Tactile cues;Verbal cues;Handout    Comprehension Verbal cues  required;Returned demonstration;Verbalized understanding                 OT Long Term Goals - 01/08/21 1949       OT LONG TERM GOAL #1   Title Pt report  L UE pain decrease from 8/10 to less than 3/10 with AROM and use during day in ADL's    Baseline pain at  rest and use 8/10 -shoulder to elbow    Time 4    Period Weeks    Status New    Target Date 02/05/21      OT LONG TERM GOAL #2   Title Pt independent in wearing of compression on L UE to decrease heaviness, pain symptoms    Baseline constant heavy, numbness , pain 8/10 i nL UE -    Time 3    Period Weeks    Status New    Target Date 01/29/21      OT LONG TERM GOAL #3   Title Pt to be independent in HEP for strengthening to report decrease heaviness and pain in L UE with ADL's and use during ay at home    Baseline heaviness, pain , numbness in L UE constarnt - increase edema from mid upper arm to distal to elbow , and wrist    Time 6    Period Weeks    Status New    Target Date 02/19/21      OT LONG TERM GOAL #4   Title Assess numbness in L hand and arm - to improve for pt to palpate textures and keys on computer    Baseline to be asess next viist    Time 1    Period Weeks    Status New    Target Date 01/15/21                   Plan - 01/25/21 0825     Clinical Impression Statement Patient with decreased pain at the biceps tendon however still has pain in the arm and numbness towards the left hand.  Responded well to techniques for manual lymphatic drainage as well as therapeutic exercises in the clinic.  Continue to monitor pain patterns, range of motion and edema and adjust treatment accordingly.  We will consider teaching patient self-directed manual lymphatic drainage techniques in the next 1-2 sessions.  Continue towards goals and plan of care to maximize safety and independence in daily tasks.    OT Occupational Profile and History Problem Focused Assessment - Including review of records relating  to presenting problem    Occupational performance deficits (Please refer to evaluation for details): ADL's;IADL's;Rest and Sleep;Work;Play;Leisure;Social Participation    Body Structure / Function / Physical Skills ADL;Sensation;Fascial restriction;ROM;IADL;Edema;UE functional use;Pain;Strength    Rehab Potential Fair    Clinical Decision Making Limited treatment options, no task modification necessary    Comorbidities Affecting Occupational Performance: None    Modification or Assistance to Complete Evaluation  No modification of tasks or assist necessary to complete eval    OT Frequency 1x / week    OT Duration 6 weeks    OT Treatment/Interventions Self-care/ADL training;Ultrasound;Contrast Bath;Therapeutic exercise;Manual Therapy;Patient/family education;Compression bandaging;Manual lymph drainage    Consulted and Agree with Plan of Care Patient             Patient will benefit from skilled therapeutic intervention in order to improve the following deficits and impairments:   Body Structure / Function / Physical Skills: ADL, Sensation, Fascial restriction, ROM, IADL, Edema, UE functional use, Pain, Strength       Visit Diagnosis: Muscle weakness (generalized)  Other disturbances of skin sensation  Pain in left arm  Localized edema    Problem List Patient Active Problem List   Diagnosis Date Noted   Paresthesia 08/01/2020   TIA (transient ischemic attack) 08/01/2020   Pain of right hip joint 01/20/2017   Right hip  pain 12/30/2016   Dizziness    HA (headache)    HYPERLIPIDEMIA 10/04/2009   HYPERGLYCEMIA 06/28/2009   Essential hypertension 10/14/2006   Headache(784.0) 10/14/2006   Overweight(278.02) 02/04/2006   Raylene Carmickle T Arne Cleveland, OTR/L, CLT  Santanna Whitford, OT 01/25/2021, 8:55 AM  [place] [facility] [address], [place], [address] Phone: [phone]   Fax:  [phone]  Name: Janaiyah Feo MRN:  [id number] Date of Birth: 1960-02-06

## 2021-01-30 ENCOUNTER — Ambulatory Visit: Payer: BC Managed Care – PPO | Admitting: Occupational Therapy

## 2021-02-06 ENCOUNTER — Ambulatory Visit: Payer: BC Managed Care – PPO | Attending: Vascular Surgery | Admitting: Occupational Therapy

## 2021-02-06 DIAGNOSIS — M79602 Pain in left arm: Secondary | ICD-10-CM | POA: Diagnosis not present

## 2021-02-06 DIAGNOSIS — R208 Other disturbances of skin sensation: Secondary | ICD-10-CM | POA: Diagnosis not present

## 2021-02-06 DIAGNOSIS — R600 Localized edema: Secondary | ICD-10-CM

## 2021-02-06 DIAGNOSIS — M6281 Muscle weakness (generalized): Secondary | ICD-10-CM

## 2021-02-09 ENCOUNTER — Encounter: Payer: Self-pay | Admitting: Occupational Therapy

## 2021-02-09 NOTE — Therapy (Signed)
[name] [address] [place], [place], [address] Phone: [phone]   Fax:  [phone]  Occupational Therapy Treatment  Patient Details  Name: [name] MRN: [id number] Date of Birth: 06-29-1960 Referring Provider (OT): Dr Karin Lieu   Encounter Date: 02/06/2021   OT End of Session - 02/09/21 0824     Visit Number 5    Number of Visits 6    Date for OT Re-Evaluation 02/19/21    OT Start Time 1600    OT Stop Time 1645    OT Time Calculation (min) 45 min    Activity Tolerance Patient tolerated treatment well    Behavior During Therapy [facility] for tasks assessed/performed             Past Medical History:  Diagnosis Date   Anal fissure    Anxiety    Chest pain    r/t anxiety/panic attacks   Diverticulitis    Diverticulosis    Dizziness    Hx in past - now resolved   GERD (gastroesophageal reflux disease)    diet controlled, no med   HA (headache)    Hyperlipidemia    diet controlled, no med   Hypertension    IBS (irritable bowel syndrome)    SOB (shortness of breath)    on occasion - resolved per patient    Past Surgical History:  Procedure Laterality Date   ABDOMINAL HYSTERECTOMY     ovaries remain   APPENDECTOMY     BREAST EXCISIONAL BIOPSY Left 2006   BREAST SURGERY     COLONOSCOPY     HIP SURGERY     LAPAROSCOPY     NECK SURGERY     cyst removed from neck   RECTAL SURGERY     thromosis/fissure   TONSILECTOMY, ADENOIDECTOMY, BILATERAL MYRINGOTOMY AND TUBES      There were no vitals filed for this visit.   Subjective Assessment - 02/09/21 0819     Subjective  Pt reports her fingers still really feel tight and swollen, questioning whether compression sleeve is too tight.  We discussed fit, use of glove or gauntlet to assist with creating a gradient for compression to help fluid out of digits and hand.  Last week she had to cancel her appt to help care for her mom.    Pertinent  History [name] is a 61 y.o. (29-Nov-1960) female who presents at the request of Brenda Fick, MD for evaluation of left arm swelling.     In June of this year, the patient had a stroke resulting in left-sided deficits.  She continues to have sequela including numbness and tingling in the left arm and leg.  Fortunately, her motor function has improved, now with only slight weakness appreciated on strength examination.   For the last several months, she is also appreciated left arm swelling, and heaviness.  Per [name], her skin feels tight, especially with flexion at the elbow.  She denies increased pain with use, rest pain, ulceration.  She has no history of DVT.  No family history of lymphedema.  No history of breast cancer, most recent mammogram normal.      [name] has returned to work, and since most of her day sitting and typing.  While the heaviness is bothersome, she is still able to feel her occupational needs.  [name] has not attempted compression, no previous vein procedures.   Refer to OT for L UE lymphedema and recommended compression  Patient Stated Goals I want the swelling , numnbess, pain better in my L arm  so I can ue it  to drive,type on computer    Currently in Pain? Yes    Pain Score 3     Pain Location Arm    Pain Orientation Left    Pain Descriptors / Indicators Aching;Numbness    Pain Type Acute pain    Pain Onset More than a month ago    Pain Frequency Intermittent            Pt reports she felt pretty good after manual lymphatic drainage massage last session but pain continues to be an issue in her UE.  Reports her fingers still feel tight but also has numbness throughout her left upper extremity.  Discussed use of glove/gauntlet for gradient pressure and to always massage towards heart and not back and forth over top of hand.    Circumferential measurements taken this date for LUE, see flow sheet for details.  Semmes Weinstein upper arm left 3.61 Lower arm and  hand 4.31  Pt seen for instruction on self directed manual lymphatic drainage techniques, therapist demo with patient actively performing.  Pt requires cues for proper form and technique.  Issued written handout for reference when attempting to perform at home, recommend 1 time a day if time permits.    Shoulder stabilization exercises in supine for left UE with place and hold with shoulder in 90 degrees of shoulder flexion, added external perturbations, scapular protraction.  Did not have tenderness over bicep tendon this date.    Standing-pt performing wall stretches for shoulder flexion, added wall pushups for 10 reps.    Response to tx: Pt continues to report numbness throughout left UE and LE with mild improvements in upper arm with sensation.  Pt has pain in the left UE and reports fingers feel full and tight.  Circumferential measurements this date were down from last session in select areas.  Instructed patient on self directed MLD as a part of her home program to help manage edema in left UE and decrease pain responses.  Pt reports she has some conflicts with schedule over the next couple weeks and will plan to call back for appt once she figures out her schedule.  Pt to continue with shoulder stabilization exercises and ROM of L shoulder as a part of home program. Will reassess pain, ROM and edema when she returns next session.  Continue towards goals in plan of care to improve LUE functional use for daily tasks.      LYMPHEDEMA/ONCOLOGY QUESTIONNAIRE - 02/08/21 0841       Left Upper Extremity Lymphedema   15 cm Proximal to Olecranon Process 37 cm    10 cm Proximal to Olecranon Process 35.8 cm    Olecranon Process 29.2 cm    15 cm Proximal to Ulnar Styloid Process 27.8 cm    10 cm Proximal to Ulnar Styloid Process 23 cm    Just Proximal to Ulnar Styloid Process 16 cm    Across Hand at [facility] 19.1 cm    At [place] of 2nd Digit 6.2 cm    At [facility] of Thumb 6.1 cm                              OT Education - 02/09/21 0823     Education Details HEP, use of compression, self directed manual lymphatic massage techniques.  Person(s) Educated Patient    Methods Explanation;Demonstration;Tactile cues;Verbal cues;Handout    Comprehension Verbal cues required;Returned demonstration;Verbalized understanding                 OT Long Term Goals - 02/09/21 0829       OT LONG TERM GOAL #1   Title Pt report  L UE pain decrease from 8/10 to less than 3/10 with AROM and use during day in ADL's    Baseline pain at rest and use 8/10 -shoulder to elbow    Time 4    Period Weeks    Status On-going    Target Date 02/05/21      OT LONG TERM GOAL #2   Title Pt independent in wearing of compression on L UE to decrease heaviness, pain symptoms    Baseline constant heavy, numbness , pain 8/10 i nL UE -    Time 3    Period Weeks    Status On-going    Target Date 01/29/21      OT LONG TERM GOAL #3   Title Pt to be independent in HEP for strengthening to report decrease heaviness and pain in L UE with ADL's and use during ay at home    Baseline heaviness, pain , numbness in L UE constarnt - increase edema from mid upper arm to distal to elbow , and wrist    Time 6    Period Weeks    Status On-going    Target Date 02/19/21      OT LONG TERM GOAL #4   Title Assess numbness in L hand and arm - to improve for pt to palpate textures and keys on computer    Baseline to be asess next viist    Time 1    Period Weeks    Status On-going    Target Date 01/15/21                   Plan - 02/09/21 0825     Clinical Impression Statement Pt continues to report numbness throughout left UE and LE with mild improvements in upper arm with sensation.  Pt has pain in the left UE and reports fingers feel full and tight.  Circumferential measurements this date were down from last session in select areas.  Instructed patient on self directed MLD as a part of  her home program to help manage edema in left UE and decrease pain responses.  Pt reports she has some conflicts with schedule over the next couple weeks and will plan to call back for appt once she figures out her schedule.  Pt to continue with shoulder stabilization exercises and ROM of L shoulder as a part of home program. Will reassess pain, ROM and edema when she returns next session.  Continue towards goals in plan of care to improve LUE functional use for daily tasks.    OT Occupational Profile and History Problem Focused Assessment - Including review of records relating to presenting problem    Occupational performance deficits (Please refer to evaluation for details): ADL's;IADL's;Rest and Sleep;Work;Play;Leisure;Social Participation    Body Structure / Function / Physical Skills ADL;Sensation;Fascial restriction;ROM;IADL;Edema;UE functional use;Pain;Strength    Rehab Potential Fair    Clinical Decision Making Limited treatment options, no task modification necessary    Comorbidities Affecting Occupational Performance: None    Modification or Assistance to Complete Evaluation  No modification of tasks or assist necessary to complete eval    OT Frequency 1x / week  OT Duration 6 weeks    OT Treatment/Interventions Self-care/ADL training;Ultrasound;Contrast Bath;Therapeutic exercise;Manual Therapy;Patient/family education;Compression bandaging;Manual lymph drainage    Consulted and Agree with Plan of Care Patient             Patient will benefit from skilled therapeutic intervention in order to improve the following deficits and impairments:   Body Structure / Function / Physical Skills: ADL, Sensation, Fascial restriction, ROM, IADL, Edema, UE functional use, Pain, Strength       Visit Diagnosis: Localized edema  Pain in left arm  Muscle weakness (generalized)  Other disturbances of skin sensation    Problem List Patient Active Problem List   Diagnosis Date Noted    Paresthesia 08/01/2020   TIA (transient ischemic attack) 08/01/2020   Pain of right hip joint 01/20/2017   Right hip pain 12/30/2016   Dizziness    HA (headache)    HYPERLIPIDEMIA 10/04/2009   HYPERGLYCEMIA 06/28/2009   Essential hypertension 10/14/2006   Headache(784.0) 10/14/2006   Overweight(278.02) 02/04/2006   Noell Lorensen T Ravenna Legore, OTR/L, CLT  Brynli Ollis, OT 02/09/2021, 8:42 AM  [facility] [address] [place], [address] Phone: [phone]   Fax:  [phone]  Name: [name] MRN: [id number] Date of Birth: 08/28/1960

## 2021-03-15 DIAGNOSIS — Z6830 Body mass index (BMI) 30.0-30.9, adult: Secondary | ICD-10-CM | POA: Diagnosis not present

## 2021-03-15 DIAGNOSIS — Z01419 Encounter for gynecological examination (general) (routine) without abnormal findings: Secondary | ICD-10-CM | POA: Diagnosis not present

## 2021-04-20 NOTE — Progress Notes (Signed)
? ?NEUROLOGY FOLLOW UP OFFICE NOTE ? ?[name] ?[id number] ? ?Assessment/Plan:  ? ?MRI-negative right thalamic infarct, likely secondary to small vessel disease ?Thalamic pain syndrome ?Hypertension ?  ?Titrate gabapentin to '600mg'$  three times daily ?ASA '81mg'$  daily ?Check lipid panel ?Normotensive blood pressure - follow up with PCP ?Follow up with PCP regarding management of left arm swelling ?  ?Subjective:  ?[name] is a 61 year old right-handed female with HTN, HLD, IBS, GERD who follows up for thalamic pain syndrome. ? ?UPDATE: ?Current medications:  asa '81mg'$ , metoprolol, gabapentin '300mg'$  TID ? ?Ordered a holter monitor which revealed no a fib.  She reported increased swelling of her left arm.  Venous doppler in November was negative for DVT.  On gabapentin for pain.  She was seen in the ED on 01/07/2021 for worsening left sided numbness and pain.  CT head personally reviewed revealed no acute intracranial abnormality.  Still with left hemisensory loss with particular pain and ice cold sensation in left upper extremity.  She went to [facility].  She got a sleeve for her arm which causes tightness.  It was suggested that she may have lymphedema.  Now the numbness has traveled from the arm into the fingers.   ?  ?HISTORY: ?At 90 PM on 07/30/2020, she was sitting up in bed watching TIV when she developed sudden onset numbness and tingling of the entire left arm. About 15 minutes later, it progressed to the lateral left leg and left side of her head and face.  She had a left occipital headache but no associated weakness or speech disturbance.  EMS was called.  Blood sugar was normal but systolic blood pressure was elevated in the 190s.  She was brought to [facility] ED where CT head was negative for acute stroke.  MRI of brain showed mild chronic small vessel ischemic changes but no acute stroke.  She was treated for a headache cocktail which helped with the headache but the numbness persisted.   She was discharged on gabapentin.  She followed up with outpatient neurology, Dr. Marcial Pacas, a couple of days later.  MRI-negative right thalamic stroke was suspected.  Echocardiogram on 08/25/2020 showed EF 60-65% with no cardiac source of embolus.  Carotid ultrasound showed no hemodynamically significant stenosis.  She was started on ASA '81mg'$  daily.  She reports that the pain and discomfort in her arm and leg have increased.  She says that her left hand feels weak.  The '300mg'$  gabapentin was too strong, so her PCP has restarted her on '100mg'$  at bedtime for 2 weeks with plan to increase to '200mg'$  at bedtime.   ? ?PAST MEDICAL HISTORY: ?Past Medical History:  ?Diagnosis Date  ? Anal fissure   ? Anxiety   ? Chest pain   ? r/t anxiety/panic attacks  ? Diverticulitis   ? Diverticulosis   ? Dizziness   ? Hx in past - now resolved  ? GERD (gastroesophageal reflux disease)   ? diet controlled, no med  ? HA (headache)   ? Hyperlipidemia   ? diet controlled, no med  ? Hypertension   ? IBS (irritable bowel syndrome)   ? SOB (shortness of breath)   ? on occasion - resolved per patient  ? ? ?MEDICATIONS: ?Current Outpatient Medications on File Prior to Visit  ?Medication Sig Dispense Refill  ? amLODipine (NORVASC) 2.5 MG tablet Take 2.5 mg by mouth daily.    ? Elastic Bandages & Supports ([place] SLEEVE M 15-20MMHG)  MISC 1 each by Does not apply route daily. 1 each 0  ? gabapentin (NEURONTIN) 100 MG capsule Take 1 capsule three times daily for one week, then 2 capsules three times daily for one week, then 3 capsules three times daily 270 capsule 0  ? metoprolol tartrate (LOPRESSOR) 25 MG tablet Take 25 mg by mouth 2 (two) times daily.    ? ?No current facility-administered medications on file prior to visit.  ? ? ?ALLERGIES: ?Allergies  ?Allergen Reactions  ? Amlodipine Besylate   ?  REACTION: headache  ? Codeine   ?  Other reaction(s): Vertigo  ? Codeine Sulfate Other (See Comments)  ?  intolerance  ? Lisinopril Swelling  ?  Lisinopril-Hydrochlorothiazide   ?  REACTION: swelling lips/angioedema  ? Meperidine Hcl Other (See Comments)  ?  Passed out ?  ? Vicodin [Hydrocodone-Acetaminophen]   ?  GI upset  ? Tape Rash  ?  Patient reports unsure if from the adhesive or latex  ? ? ?FAMILY HISTORY: ?Family History  ?Problem Relation Age of Onset  ? Diabetes Mother   ? Hypertension Mother   ? Transient ischemic attack Mother   ? Colon cancer Father 33  ? Rectal cancer Neg Hx   ? Stomach cancer Neg Hx   ? Esophageal cancer Neg Hx   ? Breast cancer Neg Hx   ? ? ?  ?Objective:  ?Blood pressure (!) 159/88, pulse (!) 59, height '5\' 6"'$  (1.676 m), weight 189 lb 12.8 oz (86.1 kg), SpO2 98 %. ?General: No acute distress.  Patient appears well-groomed.   ?Head:  Normocephalic/atraumatic ?Eyes:  Fundi examined but not visualized ?Neck: supple, no paraspinal tenderness, full range of motion ?Heart:  Regular rate and rhythm ?Lungs:  Clear to auscultation bilaterally ?Back: No paraspinal tenderness ?Neurological Exam: alert and oriented to person, place, and time.  Speech fluent and not dysarthric, language intact.  CN II-XII intact. Bulk and tone normal, muscle strength 5/5 throughout.  Sensation to pinprick reduced in left upper extremity and left lateral leg  Vibratory sensation intact.  Deep tendon reflexes 2+ throughout, toes downgoing.  Finger to nose testing intact.  Gait normal, Romberg negative. ? ? ?Metta Clines, DO ? ?CC: Brenda Seashore, MD ? ? ? ? ? ? ?

## 2021-04-23 ENCOUNTER — Ambulatory Visit (INDEPENDENT_AMBULATORY_CARE_PROVIDER_SITE_OTHER): Payer: BC Managed Care – PPO | Admitting: Neurology

## 2021-04-23 ENCOUNTER — Other Ambulatory Visit: Payer: Self-pay

## 2021-04-23 ENCOUNTER — Encounter: Payer: Self-pay | Admitting: Neurology

## 2021-04-23 ENCOUNTER — Other Ambulatory Visit (INDEPENDENT_AMBULATORY_CARE_PROVIDER_SITE_OTHER): Payer: BC Managed Care – PPO

## 2021-04-23 VITALS — BP 159/88 | HR 59 | Ht 66.0 in | Wt 189.8 lb

## 2021-04-23 DIAGNOSIS — G890 Central pain syndrome: Secondary | ICD-10-CM | POA: Diagnosis not present

## 2021-04-23 DIAGNOSIS — I6381 Other cerebral infarction due to occlusion or stenosis of small artery: Secondary | ICD-10-CM

## 2021-04-23 DIAGNOSIS — I10 Essential (primary) hypertension: Secondary | ICD-10-CM

## 2021-04-23 LAB — LIPID PANEL
Cholesterol: 251 mg/dL — ABNORMAL HIGH (ref 0–200)
HDL: 74.9 mg/dL (ref >39.00)
LDL Cholesterol: 148 mg/dL — ABNORMAL HIGH (ref 0–99)
NonHDL: 176.27
Total CHOL/HDL Ratio: 3
Triglycerides: 143 mg/dL (ref 0.0–149.0)
VLDL: 28.6 mg/dL (ref 0.0–40.0)

## 2021-04-23 MED ORDER — GABAPENTIN 300 MG PO CAPS: ORAL_CAPSULE | ORAL | 0 refills | Status: DC

## 2021-04-23 NOTE — Patient Instructions (Signed)
Titrate gabapentin up to goal of 2 pills three times daily as directed.  If no improvement after 4 weeks on 2 pills three times daily, contact me ?Check lipid panel ?Continue aspirin '81mg'$  daily ?Follow up 6 months. ?

## 2021-04-26 ENCOUNTER — Telehealth: Payer: Self-pay

## 2021-04-26 DIAGNOSIS — Z79899 Other long term (current) drug therapy: Secondary | ICD-10-CM

## 2021-04-26 MED ORDER — ROSUVASTATIN CALCIUM 5 MG PO TABS: 5.0000 mg | ORAL_TABLET | Freq: Every day | ORAL | 3 refills | Status: DC

## 2021-04-26 NOTE — Telephone Encounter (Signed)
Pt called an informed Cholesterol is elevated.  Dr Tomi Likens would like to start rosuvastatin '5mg'$  daily and repeat lipid panel as well as hepatic function panel in 3 months ?

## 2021-04-26 NOTE — Telephone Encounter (Signed)
-----   Message from Pieter Partridge, DO sent at 04/24/2021  4:24 PM EDT ----- ?Cholesterol is elevated.  I would like to start rosuvastatin '5mg'$  daily and repeat lipid panel as well as hepatic function panel in 3 months. ?

## 2021-05-14 ENCOUNTER — Other Ambulatory Visit: Payer: Self-pay | Admitting: Neurology

## 2021-06-09 DIAGNOSIS — M545 Low back pain, unspecified: Secondary | ICD-10-CM | POA: Diagnosis not present

## 2021-07-05 DIAGNOSIS — R2232 Localized swelling, mass and lump, left upper limb: Secondary | ICD-10-CM | POA: Diagnosis not present

## 2021-07-08 ENCOUNTER — Other Ambulatory Visit: Payer: Self-pay | Admitting: Neurology

## 2021-07-09 ENCOUNTER — Other Ambulatory Visit: Payer: Self-pay | Admitting: Neurology

## 2021-07-09 MED ORDER — GABAPENTIN 600 MG PO TABS: 600.0000 mg | ORAL_TABLET | Freq: Three times a day (TID) | ORAL | 5 refills | Status: DC

## 2021-07-19 DIAGNOSIS — R2232 Localized swelling, mass and lump, left upper limb: Secondary | ICD-10-CM | POA: Diagnosis not present

## 2021-07-23 ENCOUNTER — Other Ambulatory Visit: Payer: Self-pay | Admitting: Neurology

## 2021-08-03 DIAGNOSIS — E785 Hyperlipidemia, unspecified: Secondary | ICD-10-CM | POA: Diagnosis not present

## 2021-08-03 DIAGNOSIS — R7309 Other abnormal glucose: Secondary | ICD-10-CM | POA: Diagnosis not present

## 2021-08-03 DIAGNOSIS — I10 Essential (primary) hypertension: Secondary | ICD-10-CM | POA: Diagnosis not present

## 2021-08-03 DIAGNOSIS — Z0000 Encounter for general adult medical examination without abnormal findings: Secondary | ICD-10-CM | POA: Diagnosis not present

## 2021-08-10 DIAGNOSIS — G8194 Hemiplegia, unspecified affecting left nondominant side: Secondary | ICD-10-CM | POA: Diagnosis not present

## 2021-08-10 DIAGNOSIS — R7309 Other abnormal glucose: Secondary | ICD-10-CM | POA: Diagnosis not present

## 2021-08-10 DIAGNOSIS — Z0000 Encounter for general adult medical examination without abnormal findings: Secondary | ICD-10-CM | POA: Diagnosis not present

## 2021-08-10 DIAGNOSIS — I10 Essential (primary) hypertension: Secondary | ICD-10-CM | POA: Diagnosis not present

## 2021-08-10 DIAGNOSIS — Z23 Encounter for immunization: Secondary | ICD-10-CM | POA: Diagnosis not present

## 2021-10-22 NOTE — Progress Notes (Unsigned)
NEUROLOGY FOLLOW UP OFFICE NOTE  [name] [id number]  Assessment/Plan:   MRI-negative right thalamic infarct, likely secondary to small vessel disease Thalamic pain syndrome Hypertension   Titrate gabapentin to '900mg'$  three times daily.  If no improvement in 6 weeks, will change to Cymbalta. ASA '81mg'$  daily Rosuvastatin '5mg'$  daily.  Check lipid panel Normotensive blood pressure - follow up with PCP Follow up 6 months.   Subjective:  [name] is a 61 year old right-handed female with HTN, HLD, IBS, GERD who follows up for thalamic pain syndrome.  UPDATE: Current medications:  asa '81mg'$ , metoprolol, rosuvastatin '5mg'$  daily, gabapentin '600mg'$  TID  LDL in March was 148.  Started rosuvastatin '5mg'$  daily.  Titrated gabapentin up to '600mg'$  TID.  Has good days and bad days.  Worse in the fingertips/hand.  Uses a compression on the hand.    She reports that the pain and discomfort in her arm and leg have increased.  She says that her left hand feels weak.  The '300mg'$  gabapentin was too strong, so her PCP has restarted her on '100mg'$  at bedtime for 2 weeks with plan to increase to '200mg'$  at bedtime.  Still with left hemisensory loss with particular pain and ice cold sensation in left upper extremity.     HISTORY: At 48 PM on 07/30/2020, she was sitting up in bed watching TIV when she developed sudden onset numbness and tingling of the entire left arm. About 15 minutes later, it progressed to the lateral left leg and left side of her head and face.  She had a left occipital headache but no associated weakness or speech disturbance.  EMS was called.  Blood sugar was normal but systolic blood pressure was elevated in the 190s.  She was [facility] to [facility] ED where CT head was negative for acute stroke.  MRI of brain showed mild chronic small vessel ischemic changes but no acute stroke.  She was treated for a headache cocktail which helped with the headache but the numbness persisted.  She was  discharged on gabapentin.  She followed up with outpatient neurology, Dr. Marcial Pacas, a couple of days later.  MRI-negative right thalamic stroke was suspected.  Echocardiogram on 08/25/2020 showed EF 60-65% with no cardiac source of embolus.  Carotid ultrasound showed no hemodynamically significant stenosis.  Holter monitor did not reveal a fib.  She was started on ASA '81mg'$  daily.    PAST MEDICAL HISTORY: Past Medical History:  Diagnosis Date   Anal fissure    Anxiety    Chest pain    r/t anxiety/panic attacks   Diverticulitis    Diverticulosis    Dizziness    Hx in past - now resolved   GERD (gastroesophageal reflux disease)    diet controlled, no med   HA (headache)    Hyperlipidemia    diet controlled, no med   Hypertension    IBS (irritable bowel syndrome)    SOB (shortness of breath)    on occasion - resolved per patient    MEDICATIONS: Current Outpatient Medications on File Prior to Visit  Medication Sig Dispense Refill   Elastic Bandages & Supports ([place] M 15-20MMHG) MISC 1 each by Does not apply route daily. 1 each 0   gabapentin (NEURONTIN) 600 MG tablet Take 1 tablet (600 mg total) by mouth 3 (three) times daily. 90 tablet 5   metoprolol tartrate (LOPRESSOR) 25 MG tablet Take 25 mg by mouth 2 (two) times daily.  rosuvastatin (CRESTOR) 5 MG tablet TAKE 1 TABLET(5 MG) BY MOUTH DAILY 30 tablet 3   No current facility-administered medications on file prior to visit.    ALLERGIES: Allergies  Allergen Reactions   Amlodipine Besylate     REACTION: headache   Codeine     Other reaction(s): Vertigo   Codeine Sulfate Other (See Comments)    intolerance   Lisinopril Swelling   Lisinopril-Hydrochlorothiazide     REACTION: swelling lips/angioedema   Meperidine Hcl Other (See Comments)    Passed out    Vicodin [Hydrocodone-Acetaminophen]     GI upset   Tape Rash    Patient reports unsure if from the adhesive or latex    FAMILY HISTORY: Family  History  Problem Relation Age of Onset   Diabetes Mother    Hypertension Mother    Transient ischemic attack Mother    Colon cancer Father 57   Rectal cancer Neg Hx    Stomach cancer Neg Hx    Esophageal cancer Neg Hx    Breast cancer Neg Hx       Objective:  Blood pressure (!) 159/88, pulse (!) 59, height '5\' 6"'$  (1.676 m), weight 189 lb 12.8 oz (86.1 kg), SpO2 98 %. General: No acute distress.  Patient appears well-groomed.   Head:  Normocephalic/atraumatic Eyes:  Fundi examined but not visualized Neck: supple, no paraspinal tenderness, full range of motion Heart:  Regular rate and rhythm Lungs:  Clear to auscultation bilaterally Back: No paraspinal tenderness Neurological Exam: alert and oriented to person, place, and time.  Speech fluent and not dysarthric, language intact.  CN II-XII intact. Bulk and tone normal, muscle strength 5/5 throughout.  Sensation to pinprick reduced in left upper extremity and left lateral leg  Vibratory sensation intact.  Deep tendon reflexes 2+ throughout, toes downgoing.  Finger to nose testing intact.  Gait normal, Romberg negative.   Metta Clines, DO  CC: Merrilee Seashore, MD

## 2021-10-24 ENCOUNTER — Other Ambulatory Visit (INDEPENDENT_AMBULATORY_CARE_PROVIDER_SITE_OTHER): Payer: BC Managed Care – PPO

## 2021-10-24 ENCOUNTER — Ambulatory Visit (INDEPENDENT_AMBULATORY_CARE_PROVIDER_SITE_OTHER): Payer: BC Managed Care – PPO | Admitting: Neurology

## 2021-10-24 ENCOUNTER — Encounter: Payer: Self-pay | Admitting: Neurology

## 2021-10-24 VITALS — BP 151/87 | HR 96 | Ht 66.0 in | Wt 197.4 lb

## 2021-10-24 DIAGNOSIS — I639 Cerebral infarction, unspecified: Secondary | ICD-10-CM

## 2021-10-24 DIAGNOSIS — G890 Central pain syndrome: Secondary | ICD-10-CM

## 2021-10-24 LAB — LIPID PANEL
Cholesterol: 168 mg/dL (ref 0–200)
HDL: 65.3 mg/dL (ref >39.00)
LDL Cholesterol: 74 mg/dL (ref 0–99)
NonHDL: 102.97
Total CHOL/HDL Ratio: 3
Triglycerides: 145 mg/dL (ref 0.0–149.0)
VLDL: 29 mg/dL (ref 0.0–40.0)

## 2021-10-24 MED ORDER — GABAPENTIN 300 MG PO CAPS: 900.0000 mg | ORAL_CAPSULE | Freq: Three times a day (TID) | ORAL | 5 refills | Status: DC

## 2021-10-24 NOTE — Patient Instructions (Signed)
Titrate up on gabapentin to: '600mg'$  in morning, '600mg'$  afternoon, '900mg'$  at night for one week, then '900mg'$  in morning, '600mg'$  afternoon, '900mg'$  at night for one week, then '900mg'$  three times daily If no improvement in 6 weeks, contact me. Check lipid panel Follow up 6 onths.

## 2021-11-12 ENCOUNTER — Other Ambulatory Visit: Payer: Self-pay | Admitting: Internal Medicine

## 2021-11-12 DIAGNOSIS — Z1231 Encounter for screening mammogram for malignant neoplasm of breast: Secondary | ICD-10-CM

## 2021-12-10 ENCOUNTER — Ambulatory Visit
Admission: RE | Admit: 2021-12-10 | Discharge: 2021-12-10 | Disposition: A | Discharge status: Home / Self Care | Payer: BC Managed Care – PPO | Source: Ambulatory Visit | Attending: Internal Medicine | Admitting: Internal Medicine

## 2021-12-10 DIAGNOSIS — Z1231 Encounter for screening mammogram for malignant neoplasm of breast: Secondary | ICD-10-CM

## 2021-12-18 ENCOUNTER — Other Ambulatory Visit: Payer: Self-pay | Admitting: Neurology

## 2022-01-03 ENCOUNTER — Telehealth: Payer: Self-pay

## 2022-01-03 NOTE — Patient Outreach (Signed)
  Care Coordination   Initial Visit Note   01/03/2022 Name: [name] MRN: [id number] DOB: 02-Mar-1960  [name] is a 61 y.o. year old female who sees Merrilee Seashore, MD for primary care. I spoke with  [name] by phone today.  What matters to the patients health and wellness today?  none    Goals Addressed             This Visit's Progress    COMPLETED: Care Coordination Activities-No follow up required.       Care Coordination Interventions: Advised patient to follow up with physician. Patient has appointment next week.  Discussed [facility] services. Patient decline at this time.            SDOH assessments and interventions completed:  No     Care Coordination Interventions:  No, not indicated   Follow up plan: No further intervention required.   Encounter Outcome:  Pt. Visit Completed   Jone Baseman, RN, MSN [facility] Care Management Coordinator Direct Line [phone]

## 2022-01-03 NOTE — Patient Instructions (Signed)
Visit Information  Thank you for taking time to visit with me today. Please don't hesitate to contact me if I can be of assistance to you.   Following are the goals we discussed today:   Goals Addressed             This Visit's Progress    COMPLETED: Care Coordination Activities-No follow up required.       Care Coordination Interventions: Advised patient to follow up with physician. Patient has appointment next week.  Discussed [facility] services. Patient decline at this time.             If you are experiencing a Mental Health or [place] or need someone to talk to, please call the Suicide and Crisis Lifeline: 988   Patient verbalizes understanding of instructions and care plan provided today and agrees to view in [place]. Active MyChart status and patient understanding of how to access instructions and care plan via MyChart confirmed with patient.     No further follow up required:    Jone Baseman, RN, MSN [facility] Care Management Coordinator Direct Line [phone]

## 2022-01-04 DIAGNOSIS — E785 Hyperlipidemia, unspecified: Secondary | ICD-10-CM | POA: Diagnosis not present

## 2022-01-04 DIAGNOSIS — I10 Essential (primary) hypertension: Secondary | ICD-10-CM | POA: Diagnosis not present

## 2022-01-04 DIAGNOSIS — R7309 Other abnormal glucose: Secondary | ICD-10-CM | POA: Diagnosis not present

## 2022-01-11 DIAGNOSIS — G890 Central pain syndrome: Secondary | ICD-10-CM | POA: Diagnosis not present

## 2022-01-11 DIAGNOSIS — Z23 Encounter for immunization: Secondary | ICD-10-CM | POA: Diagnosis not present

## 2022-01-11 DIAGNOSIS — I10 Essential (primary) hypertension: Secondary | ICD-10-CM | POA: Diagnosis not present

## 2022-01-11 DIAGNOSIS — G8194 Hemiplegia, unspecified affecting left nondominant side: Secondary | ICD-10-CM | POA: Diagnosis not present

## 2022-01-11 DIAGNOSIS — R7309 Other abnormal glucose: Secondary | ICD-10-CM | POA: Diagnosis not present

## 2022-01-12 ENCOUNTER — Other Ambulatory Visit: Payer: Self-pay | Admitting: Neurology

## 2022-01-17 ENCOUNTER — Encounter: Payer: Self-pay | Admitting: Physical Medicine & Rehabilitation

## 2022-02-14 ENCOUNTER — Encounter
Payer: BC Managed Care – PPO | Attending: Physical Medicine & Rehabilitation | Admitting: Physical Medicine & Rehabilitation

## 2022-02-14 ENCOUNTER — Encounter: Payer: Self-pay | Admitting: Physical Medicine & Rehabilitation

## 2022-02-14 VITALS — BP 159/83 | HR 54 | Ht 66.0 in | Wt 195.0 lb

## 2022-02-14 DIAGNOSIS — G890 Central pain syndrome: Secondary | ICD-10-CM

## 2022-02-14 NOTE — Progress Notes (Signed)
Subjective:    Patient ID: [name], female    DOB: [birth year], 62 y.o.   MRN: [id number]  HPI 2022 RIght thalamic stroke with Left hemibody weakness Chronic left sided weakness  ~85moafter CVA RIght hand became more painful  Has been on increasing doses of gabapentin- currently on '600mg'$  TID  Some days pain is severe Left hand feels like frost bite Left arm compression garments, above elbow and gauntlet Patient feels that the swelling got worse when she wore her garments. The patient did have left upper extremity Doppler which is negative The patient denies any particular finger involvement No neck pain.  Mod I for all ADLs and housework, also works FT   Pain Inventory Average Pain 8 Pain Right Now 8 My pain is tingling and aching  In the last 24 hours, has pain interfered with the following? General activity 0 Relation with others 0 Enjoyment of life 0 What TIME of day is your pain at its worst? daytime and evening Sleep (in general) Fair  Pain is worse with: unsure Pain improves with: medication Relief from Meds: 6  walk without assistance  employed # of hrs/week 40  numbness tingling  Any changes since last visit?  no  Any changes since last visit?  no    Family History  Problem Relation Age of Onset   Diabetes Mother    Hypertension Mother    Transient ischemic attack Mother    Colon cancer Father 510  Rectal cancer Neg Hx    Stomach cancer Neg Hx    Esophageal cancer Neg Hx    Breast cancer Neg Hx    Social History   Socioeconomic History   Marital status: Single    Spouse name: n/a   Number of children: 1   Years of education: Associate's Degree   Highest education level: Not on file  Occupational History   Occupation: [employer], building services [employer]    Comment: CUSTOMER [employer]  Tobacco Use   Smoking status: Former    Types: Cigarettes    Quit date: 04/02/1968    Years since quitting: 53.9   Smokeless tobacco: Never   Substance and Sexual Activity   Alcohol use: Yes    Alcohol/week: 3.0 standard drinks of alcohol    Types: 3 Glasses of wine per week    Comment: occasionally   Drug use: No   Sexual activity: Yes    Birth control/protection: Other-see comments    Comment: Hysterectomy  Other Topics Concern   Not on file  Social History Narrative   Patient is single and she works for [employer] Patient has her associates degree. One child (a son, born in 116.   Caffeine- One iced every three days.   Right handed.   Two story home   Social Determinants of Health   Financial Resource Strain: Not on file  Food Insecurity: Not on file  Transportation Needs: Not on file  Physical Activity: Not on file  Stress: Not on file  Social Connections: Not on file   Past Surgical History:  Procedure Laterality Date   ABDOMINAL HYSTERECTOMY     ovaries remain   APPENDECTOMY     BREAST EXCISIONAL BIOPSY Left 2006   BREAST SURGERY     COLONOSCOPY     HIP SURGERY     LAPAROSCOPY     NECK SURGERY     cyst removed from neck   RECTAL SURGERY  thromosis/fissure   TONSILECTOMY, ADENOIDECTOMY, BILATERAL MYRINGOTOMY AND TUBES     Past Medical History:  Diagnosis Date   Anal fissure    Anxiety    Chest pain    r/t anxiety/panic attacks   Diverticulitis    Diverticulosis    Dizziness    Hx in past - now resolved   GERD (gastroesophageal reflux disease)    diet controlled, no med   HA (headache)    Hyperlipidemia    diet controlled, no med   Hypertension    IBS (irritable bowel syndrome)    SOB (shortness of breath)    on occasion - resolved per patient   There were no vitals taken for this visit.  Opioid Risk Score:   Fall Risk Score:  `1  Depression screen PHQ 2/9     03/06/2016    3:24 PM 01/09/2016    9:26 AM 06/12/2015    5:31 PM 01/14/2015    3:14 PM  Depression screen PHQ 2/9  Decreased Interest 0 0 0 0  Down, Depressed, Hopeless 0 0 1 0  PHQ - 2 Score 0 0 1 0       Review of Systems  Musculoskeletal:        Left Arm/Hand pain  All other systems reviewed and are negative.     Objective:   Physical Exam  General no acute distress Mood and affect appropriate Speech without dysarthria or aphasia Motor strength is 5/5 bilateral deltoid, bicep, tricep, grip, hip flexor, knee extensor, ankle dorsiflexor and plantar flexor No pain with cervical spine range of motion No pain with shoulder elbow wrist or finger range of motion bilaterally No evidence of joint deformity in the fingers or wrist.  No erythema.  No hypersensitivity to touch in the left upper and lower limb Negative hyperpathia to pinprick She does have diminished sensation to pinprick and light touch in the left upper and lower limb as well as left hemifacial area compared to the right side but still able to distinguish Temperature sensation is diminished on the left side as well in the upper extremity. Ambulates without assistive device no evidence of toe drag knee instability Extremities without edema except for mild dorsal foot edema bilaterally Radial posterior tibial and pedal pulses are normal      Assessment & Plan:  1.  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  She does have sensory deficits left hemibody.  The patient is beyond the time of natural recovery No other localizing signs of focal compression neuropathy or cervical radiculopathy. The patient is having continuing pain despite gabapentin 900 mg 3 times daily, has follow-up appointment scheduled with neurology with plan to add duloxetine if the gabapentin is not adequate to control symptoms. Long discussion with patient regarding postop pain syndromes.  Do not think additional workup with EMG/NCV is needed The potential treatments would include tricyclic antidepressant at night or trial of pregabalin in place of gabapentin.  I will see her back on as-needed basis.

## 2022-02-15 ENCOUNTER — Telehealth: Payer: Self-pay | Admitting: Neurology

## 2022-02-15 MED ORDER — DULOXETINE HCL 30 MG PO CPEP: ORAL_CAPSULE | ORAL | 0 refills | Status: DC

## 2022-02-15 NOTE — Telephone Encounter (Signed)
1. Send prescription for duloxetine '30mg'$  - '30mg'$  daily for one week, then increase to '60mg'$  daily 2. Taper off gabapentin to 2 pills three times daily for 3 days, then 1 pill daily for 3 days, then stop.

## 2022-02-15 NOTE — Telephone Encounter (Signed)
LMOVM for patient to call the office back.

## 2022-02-15 NOTE — Telephone Encounter (Signed)
Pt called in and left a message. She stated she thinks her medication is not working as well as it should and is interested in changing it. She did not specify which medication.

## 2022-02-15 NOTE — Telephone Encounter (Signed)
Per patient the Gabapentin 900 mg TID hasn't helped at all. Per patient she is struggling with her left side.  Per ov note 10/24/21, Titrate gabapentin to '900mg'$  three times daily.  If no improvement in 6 weeks, will change to Cymbalta.   Please advise

## 2022-02-23 ENCOUNTER — Telehealth: Payer: BC Managed Care – PPO | Admitting: Nurse Practitioner

## 2022-02-23 DIAGNOSIS — R110 Nausea: Secondary | ICD-10-CM | POA: Diagnosis not present

## 2022-02-23 DIAGNOSIS — T887XXA Unspecified adverse effect of drug or medicament, initial encounter: Secondary | ICD-10-CM | POA: Diagnosis not present

## 2022-02-23 MED ORDER — ONDANSETRON HCL 4 MG PO TABS: 4.0000 mg | ORAL_TABLET | Freq: Three times a day (TID) | ORAL | 0 refills | Status: DC | PRN

## 2022-02-23 NOTE — Patient Instructions (Signed)
  [name], thank you for joining Gildardo Pounds, NP for today's virtual visit.  While this provider is not your primary care provider (PCP), if your PCP is located in our provider database this encounter information will be shared with them immediately following your visit.   [facility] account gives you access to today's visit and all your visits, tests, and labs performed at [facility] " click here if you don't have a [facility] account or go to [email or web address]  Consent: (Patient) [name] provided verbal consent for this virtual visit at the beginning of the encounter.  Current Medications:  Current Outpatient Medications:    ondansetron (ZOFRAN) 4 MG tablet, Take 1 tablet (4 mg total) by mouth every 8 (eight) hours as needed for nausea or vomiting., Disp: 20 tablet, Rfl: 0   DULoxetine (CYMBALTA) 30 MG capsule, '30mg'$  daily for one week, then increase to '60mg'$  daily, Disp: 60 capsule, Rfl: 0   Elastic Bandages & Supports ([place] M 15-20MMHG) MISC, 1 each by Does not apply route daily., Disp: 1 each, Rfl: 0   gabapentin (NEURONTIN) 300 MG capsule, Take 3 capsules (900 mg total) by mouth 3 (three) times daily., Disp: 270 capsule, Rfl: 5   metoprolol tartrate (LOPRESSOR) 25 MG tablet, Take 25 mg by mouth 2 (two) times daily., Disp: , Rfl:    rosuvastatin (CRESTOR) 5 MG tablet, TAKE 1 TABLET(5 MG) BY MOUTH DAILY, Disp: 30 tablet, Rfl: 3   Medications ordered in this encounter:  Meds ordered this encounter  Medications   ondansetron (ZOFRAN) 4 MG tablet    Sig: Take 1 tablet (4 mg total) by mouth every 8 (eight) hours as needed for nausea or vomiting.    Dispense:  20 tablet    Refill:  0    Order Specific Question:   Supervising Provider    Answer:   Chase Picket [id number]     *If you need refills on other medications prior to your next appointment, please contact your pharmacy*  Follow-Up: Call back or seek an  in-person evaluation if the symptoms worsen or if the condition fails to improve as anticipated.  [place] [phone]  Other Instructions Follow-up with Dr. Tomi Likens on Monday for medication instructions   If you have been instructed to have an in-person evaluation today at a local Urgent Care facility, please use the link below. It will take you to a list of all of our available [facility], including address, phone number and hours of operation. Please do not delay care.  [facility]  If you or a family member do not have a primary care provider, use the link below to schedule a visit and establish care. When you choose a [facility] primary care physician or advanced practice provider, you gain a long-term partner in health. Find a Primary Care Provider  Learn more about [facility]'s in-office and virtual care options: [place]

## 2022-02-23 NOTE — Progress Notes (Signed)
Virtual Visit Consent   [name], you are scheduled for a virtual visit with a [facility] provider today. Just as with appointments in the office, your consent must be obtained to participate. Your consent will be active for this visit and any virtual visit you may have with one of our providers in the next 365 days. If you have a MyChart account, a copy of this consent can be sent to you electronically.  As this is a virtual visit, video technology does not allow for your provider to perform a traditional examination. This may limit your provider's ability to fully assess your condition. If your provider identifies any concerns that need to be evaluated in person or the need to arrange testing (such as labs, EKG, etc.), we will make arrangements to do so. Although advances in technology are sophisticated, we cannot ensure that it will always work on either your end or our end. If the connection with a video visit is poor, the visit may have to be switched to a telephone visit. With either a video or telephone visit, we are not always able to ensure that we have a secure connection.  By engaging in this virtual visit, you consent to the provision of healthcare and authorize for your insurance to be billed (if applicable) for the services provided during this visit. Depending on your insurance coverage, you may receive a charge related to this service.  I need to obtain your verbal consent now. Are you willing to proceed with your visit today? [name] has provided verbal consent on 02/23/2022 for a virtual visit (video or telephone). Gildardo Pounds, NP  Date: 02/23/2022 11:00 AM  Virtual Visit via Video Note   I, Gildardo Pounds, connected with  [name]  ([id number], Apr 20, 1960) on 02/23/22 at 10:45 AM EST by a video-enabled telemedicine application and verified that I am speaking with the correct person using two identifiers.  Location: Patient: Virtual Visit Location Patient:  Home Provider: Virtual Visit Location Provider: Home Office   I discussed the limitations of evaluation and management by telemedicine and the availability of in person appointments. The patient expressed understanding and agreed to proceed.    History of Present Illness: [name] is a 62 y.o. who identifies as a female who was assigned female at birth, and is being seen today for medication side effects.  [name] is currently being weaned off of her gabapentin with bridging over to duloxetine.  She has been on gabapentin 900 mg TID for 2 years.  Despite a slow wean off of gabapentin over the past few weeks she has started to have medication side effects.  Currently down to 300 mg 3 times daily of gabapentin and 30 mg of duloxetine.  Over the past few days she has begun to experience nausea, insomnia, diarrhea and overall not feeling well.  I will send Zofran today and have instructed her to continue the gabapentin only at this time and follow-up with Dr. Tomi Likens on Monday.  She has unable to tolerate the the incresae of 30 mg to 60 mg Cymbalta.     Problems:  Patient Active Problem List   Diagnosis Date Noted   Paresthesia 08/01/2020   TIA (transient ischemic attack) 08/01/2020   Pain of right hip joint 01/20/2017   Right hip pain 12/30/2016   Dizziness    HA (headache)    HYPERLIPIDEMIA 10/04/2009   HYPERGLYCEMIA 06/28/2009   Essential hypertension 10/14/2006   Headache(784.0) 10/14/2006   Overweight(278.02)  02/04/2006    Allergies:  Allergies  Allergen Reactions   Amlodipine Besylate     REACTION: headache   Codeine     Other reaction(s): Vertigo   Codeine Sulfate Other (See Comments)    intolerance   Lisinopril Swelling   Lisinopril-Hydrochlorothiazide     REACTION: swelling lips/angioedema   Meperidine Hcl Other (See Comments)    Passed out    Vicodin [Hydrocodone-Acetaminophen]     GI upset   Tape Rash    Patient reports unsure if from the adhesive or latex    Medications:  Current Outpatient Medications:    ondansetron (ZOFRAN) 4 MG tablet, Take 1 tablet (4 mg total) by mouth every 8 (eight) hours as needed for nausea or vomiting., Disp: 20 tablet, Rfl: 0   DULoxetine (CYMBALTA) 30 MG capsule, '30mg'$  daily for one week, then increase to '60mg'$  daily, Disp: 60 capsule, Rfl: 0   Elastic Bandages & Supports ([place] M 15-20MMHG) MISC, 1 each by Does not apply route daily., Disp: 1 each, Rfl: 0   gabapentin (NEURONTIN) 300 MG capsule, Take 3 capsules (900 mg total) by mouth 3 (three) times daily., Disp: 270 capsule, Rfl: 5   metoprolol tartrate (LOPRESSOR) 25 MG tablet, Take 25 mg by mouth 2 (two) times daily., Disp: , Rfl:    rosuvastatin (CRESTOR) 5 MG tablet, TAKE 1 TABLET(5 MG) BY MOUTH DAILY, Disp: 30 tablet, Rfl: 3  Observations/Objective: Patient is well-developed, well-nourished in no acute distress.  Resting comfortably at home.  Head is normocephalic, atraumatic.  No labored breathing.  Speech is clear and coherent with logical content.  Patient is alert and oriented at baseline.    Assessment and Plan: 1. Nausea - ondansetron (ZOFRAN) 4 MG tablet; Take 1 tablet (4 mg total) by mouth every 8 (eight) hours as needed for nausea or vomiting.  Dispense: 20 tablet; Refill: 0  2. Medication side effect - ondansetron (ZOFRAN) 4 MG tablet; Take 1 tablet (4 mg total) by mouth every 8 (eight) hours as needed for nausea or vomiting.  Dispense: 20 tablet; Refill: 0  Follow-up with Dr. Tomi Likens on Monday for medication instructions  Follow Up Instructions: I discussed the assessment and treatment plan with the patient. The patient was provided an opportunity to ask questions and all were answered. The patient agreed with the plan and demonstrated an understanding of the instructions.  A copy of instructions were sent to the patient via MyChart unless otherwise noted below.    The patient was advised to call back or seek an in-person  evaluation if the symptoms worsen or if the condition fails to improve as anticipated.  Time:  I spent 12 minutes with the patient via telehealth technology discussing the above problems/concerns.    Gildardo Pounds, NP

## 2022-02-25 ENCOUNTER — Other Ambulatory Visit: Payer: Self-pay | Admitting: Neurology

## 2022-02-25 MED ORDER — PREGABALIN 50 MG PO CAPS: ORAL_CAPSULE | ORAL | 0 refills | Status: DC

## 2022-02-25 NOTE — Telephone Encounter (Signed)
Pt called in and left a message with the access nurse on 02/23/22. She stated she has had a change in medication. She has been on the medication for a week, and started to feel very nauseous and weak yesterday.   Full access nurse report is in Dr. Georgie Chard box

## 2022-02-27 ENCOUNTER — Telehealth: Payer: Self-pay | Admitting: Anesthesiology

## 2022-02-27 NOTE — Telephone Encounter (Signed)
Per patient she will stick with the Lyrica for now and see how she do in  48 hours.

## 2022-02-27 NOTE — Telephone Encounter (Signed)
Pt called stating she started taking Lyrica 50 mg since 1/22 once a day. She is not sleeping. She goes to bed but is unable to fall asleep. Pt asking if it is because of medication.

## 2022-03-08 ENCOUNTER — Encounter: Payer: BC Managed Care – PPO | Admitting: Physical Medicine & Rehabilitation

## 2022-03-26 DIAGNOSIS — B3731 Acute candidiasis of vulva and vagina: Secondary | ICD-10-CM | POA: Diagnosis not present

## 2022-03-26 DIAGNOSIS — N760 Acute vaginitis: Secondary | ICD-10-CM | POA: Diagnosis not present

## 2022-03-26 DIAGNOSIS — Z01419 Encounter for gynecological examination (general) (routine) without abnormal findings: Secondary | ICD-10-CM | POA: Diagnosis not present

## 2022-03-26 DIAGNOSIS — Z6831 Body mass index (BMI) 31.0-31.9, adult: Secondary | ICD-10-CM | POA: Diagnosis not present

## 2022-04-17 ENCOUNTER — Other Ambulatory Visit: Payer: Self-pay | Admitting: Neurology

## 2022-04-19 DIAGNOSIS — I10 Essential (primary) hypertension: Secondary | ICD-10-CM | POA: Diagnosis not present

## 2022-04-19 DIAGNOSIS — G8194 Hemiplegia, unspecified affecting left nondominant side: Secondary | ICD-10-CM | POA: Diagnosis not present

## 2022-04-19 DIAGNOSIS — R7309 Other abnormal glucose: Secondary | ICD-10-CM | POA: Diagnosis not present

## 2022-04-23 NOTE — Progress Notes (Unsigned)
NEUROLOGY FOLLOW UP OFFICE NOTE  [name] [id number]  Assessment/Plan:   Thalamic pain syndrome secondary to MRI-negative right thalamic infarct, likely secondary to small vessel disease Hypertension Hyperlipidemia    Thalamic pain syndrome:  Continue gabapentin 900mg  three times daily.  Will start nortriptyline 10mg  at bedtime to optimize pain control. We can increase to 25mg  at bedtime in 4 weeks if needed.  If blood pressure becomes more elevated or difficult to control, she was advised to contact me and we would have to discontinue it.   Secondary stroke prevention as managed by PCP: ASA 81mg  daily Rosuvastatin.  LDL goal less than 70 Normotensive blood pressure - follow up with PCP Follow up 4 to 5 months.   Subjective:  [name] is a 62 year old right-handed female with HTN, HLD, IBS, GERD who follows up for thalamic pain syndrome.  UPDATE: Current medications:  asa 81mg , metoprolol, rosuvastatin 5mg  daily, pregablin 50mg  twice daily  Last visit, increased gabapentin to 900mg  three times daily, which was ineffective.  Transitioned to duloxetine, but this made her sick..  She was then started on pregablin.  However, she had side effects to the pregablin as well.  She is back on gabapentin 900mg  three times daily.  It helps but still not completely manageable.  Most of the pain is in the arm while the leg just feels numb.    04/19/2022 LABS:  Hgb A1c 6.2, LDL 83   HISTORY: At 11 PM on 07/30/2020, she was sitting up in bed watching TIV when she developed sudden onset numbness and tingling of the entire left arm. About 15 minutes later, it progressed to the lateral left leg and left side of her head and face.  She had a left occipital headache but no associated weakness or speech disturbance.  EMS was called.  Blood sugar was normal but systolic blood pressure was elevated in the 190s.  She was brought to [facility] ED where CT head was negative for acute stroke.  MRI of  brain showed mild chronic small vessel ischemic changes but no acute stroke.  She was treated for a headache cocktail which helped with the headache but the numbness persisted.  She was discharged on gabapentin.  She followed up with outpatient neurology, Dr. Marcial Pacas, a couple of days later.  MRI-negative right thalamic stroke was suspected.  Echocardiogram on 08/25/2020 showed EF 60-65% with no cardiac source of embolus.  Carotid ultrasound showed no hemodynamically significant stenosis.  Holter monitor did not reveal a fib.  She was started on ASA 81mg  daily.    Past medications:  duloxetine 60mg  daily (side effects), pregablin (side effects)  PAST MEDICAL HISTORY: Past Medical History:  Diagnosis Date   Anal fissure    Anxiety    Chest pain    r/t anxiety/panic attacks   Diverticulitis    Diverticulosis    Dizziness    Hx in past - now resolved   GERD (gastroesophageal reflux disease)    diet controlled, no med   HA (headache)    Hyperlipidemia    diet controlled, no med   Hypertension    IBS (irritable bowel syndrome)    SOB (shortness of breath)    on occasion - resolved per patient    MEDICATIONS: Current Outpatient Medications on File Prior to Visit  Medication Sig Dispense Refill   DULoxetine (CYMBALTA) 30 MG capsule 30mg  daily for one week, then increase to 60mg  daily 60 capsule 0   Elastic Bandages &  Supports (TRUFORM ARM 63 M 15-20MMHG) MISC 1 each by Does not apply route daily. 1 each 0   metoprolol tartrate (LOPRESSOR) 25 MG tablet Take 25 mg by mouth 2 (two) times daily.     ondansetron (ZOFRAN) 4 MG tablet Take 1 tablet (4 mg total) by mouth every 8 (eight) hours as needed for nausea or vomiting. 20 tablet 0   pregabalin (LYRICA) 50 MG capsule Take 1 capsule at bedtime for one week, then increase to 1 capsule twice daily 60 capsule 0   rosuvastatin (CRESTOR) 5 MG tablet TAKE 1 TABLET(5 MG) BY MOUTH DAILY 30 tablet 0   No current facility-administered  medications on file prior to visit.    ALLERGIES: Allergies  Allergen Reactions   Amlodipine Besylate     REACTION: headache   Codeine     Other reaction(s): Vertigo   Codeine Sulfate Other (See Comments)    intolerance   Lisinopril Swelling   Lisinopril-Hydrochlorothiazide     REACTION: swelling lips/angioedema   Meperidine Hcl Other (See Comments)    Passed out    Vicodin [Hydrocodone-Acetaminophen]     GI upset   Tape Rash    Patient reports unsure if from the adhesive or latex    FAMILY HISTORY: Family History  Problem Relation Age of Onset   Diabetes Mother    Hypertension Mother    Transient ischemic attack Mother    Colon cancer Father 45   Rectal cancer Neg Hx    Stomach cancer Neg Hx    Esophageal cancer Neg Hx    Breast cancer Neg Hx       Objective:  Blood pressure 131/76, pulse 100, height 5\' 6"  (1.676 m), weight 188 lb 3.2 oz (85.4 kg), SpO2 97 %. General: No acute distress.  Patient appears well-groomed.      Metta Clines, DO  CC: Merrilee Seashore, MD

## 2022-04-24 ENCOUNTER — Ambulatory Visit (INDEPENDENT_AMBULATORY_CARE_PROVIDER_SITE_OTHER): Payer: BC Managed Care – PPO | Admitting: Neurology

## 2022-04-24 ENCOUNTER — Encounter: Payer: Self-pay | Admitting: Neurology

## 2022-04-24 VITALS — BP 131/76 | HR 100 | Ht 66.0 in | Wt 188.2 lb

## 2022-04-24 DIAGNOSIS — I6381 Other cerebral infarction due to occlusion or stenosis of small artery: Secondary | ICD-10-CM | POA: Diagnosis not present

## 2022-04-24 DIAGNOSIS — I10 Essential (primary) hypertension: Secondary | ICD-10-CM

## 2022-04-24 DIAGNOSIS — E785 Hyperlipidemia, unspecified: Secondary | ICD-10-CM | POA: Diagnosis not present

## 2022-04-24 DIAGNOSIS — G890 Central pain syndrome: Secondary | ICD-10-CM | POA: Diagnosis not present

## 2022-04-24 MED ORDER — NORTRIPTYLINE HCL 10 MG PO CAPS: 10.0000 mg | ORAL_CAPSULE | Freq: Every day | ORAL | 5 refills | Status: DC

## 2022-04-24 NOTE — Patient Instructions (Signed)
Continue gabapentin 900mg  three times daily Start nortriptyline 10mg  at bedtime.  If no improvement in pain in 4 weeks, contact me.  This medication may elevate blood pressure.  If your blood pressure becomes more elevated or difficult to control, let me know and we would have to stop it. Follow up 4-5 months.

## 2022-04-26 DIAGNOSIS — I10 Essential (primary) hypertension: Secondary | ICD-10-CM | POA: Diagnosis not present

## 2022-04-26 DIAGNOSIS — G8194 Hemiplegia, unspecified affecting left nondominant side: Secondary | ICD-10-CM | POA: Diagnosis not present

## 2022-04-26 DIAGNOSIS — E785 Hyperlipidemia, unspecified: Secondary | ICD-10-CM | POA: Diagnosis not present

## 2022-04-26 DIAGNOSIS — R7309 Other abnormal glucose: Secondary | ICD-10-CM | POA: Diagnosis not present

## 2022-05-17 ENCOUNTER — Other Ambulatory Visit: Payer: Self-pay | Admitting: Neurology

## 2022-05-27 ENCOUNTER — Other Ambulatory Visit: Payer: Self-pay | Admitting: Neurology

## 2022-05-28 ENCOUNTER — Other Ambulatory Visit (INDEPENDENT_AMBULATORY_CARE_PROVIDER_SITE_OTHER): Payer: BC Managed Care – PPO

## 2022-05-28 ENCOUNTER — Encounter: Payer: Self-pay | Admitting: Sports Medicine

## 2022-05-28 ENCOUNTER — Ambulatory Visit (INDEPENDENT_AMBULATORY_CARE_PROVIDER_SITE_OTHER): Payer: BC Managed Care – PPO | Admitting: Sports Medicine

## 2022-05-28 DIAGNOSIS — M25561 Pain in right knee: Secondary | ICD-10-CM

## 2022-05-28 DIAGNOSIS — M1711 Unilateral primary osteoarthritis, right knee: Secondary | ICD-10-CM

## 2022-05-28 DIAGNOSIS — M25461 Effusion, right knee: Secondary | ICD-10-CM

## 2022-05-28 MED ORDER — BUPIVACAINE HCL 0.25 % IJ SOLN
2.0000 mL | INTRAMUSCULAR | Status: AC | PRN
  Administered 2022-05-28: 2 mL via INTRA_ARTICULAR

## 2022-05-28 MED ORDER — METHYLPREDNISOLONE ACETATE 40 MG/ML IJ SUSP
80.0000 mg | INTRAMUSCULAR | Status: AC | PRN
  Administered 2022-05-28: 80 mg via INTRA_ARTICULAR

## 2022-05-28 MED ORDER — LIDOCAINE HCL 1 % IJ SOLN
2.0000 mL | INTRAMUSCULAR | Status: AC | PRN
  Administered 2022-05-28: 2 mL

## 2022-05-28 NOTE — Progress Notes (Signed)
2 weeks of pain No injury; just got up one morning and it was stiff, swollen and unable to bend it Slight warm to touch Occasional tylenol for pain; no relief

## 2022-05-28 NOTE — Progress Notes (Signed)
[name] - 61 y.o. female MRN [id number]  Date of birth: 18-Dec-1960  Office Visit Note: Visit Date: 05/28/2022 PCP: Georgianne Fick, MD Referred by: Georgianne Fick, MD  Subjective: Chief Complaint  Patient presents with   Right Knee - Pain   HPI: [name] is a very pleasant 62 y.o. female who presents today for acute right knee pain.  No specific injury.  Has had pain for about 2 weeks.  States 1 morning she woke and it was somewhat stiff and swollen.  She has been taking Tylenol as needed.  She does note that the swelling has somewhat improved but still present.  Denies any instability about the knee.  She has no history of gout.  She is not diabetic.  Pertinent ROS were reviewed with the patient and found to be negative unless otherwise specified above in HPI.   Assessment & Plan: Visit Diagnoses:  1. Acute pain of right knee   2. Patellofemoral arthritis of right knee   3. Effusion, right knee    Plan: Discussed with [name] the causes of her knee pain and swelling.  She does have some mild to moderate patellofemoral arthritic change with bilateral patellar tilt, although her tibiofemoral joint spaces are relatively well-preserved. She had no inciting event, no true meniscal symptoms on provocative examination today.  The true etiology of her knee swelling is unclear at this time but  she does have a small joint effusion.  We discussed all treatment options such as oral medication, injection therapy, physical therapy.  Through shared decision making, elected to proceed with corticosteroid injection, patient tolerated well.  Ice, over-the-counter anti-inflammatories as needed for any pain or postinjection pain.  I do think this will settle down her pain over the coming weeks.  I would like her to follow-up in 1 month for reevaluation.  Additional treatment considerations: Formalized physical therapy versus home therapy, MRI to better quantify the internal  structures.  Follow-up: Return in about 1 month (around 06/27/2022) for for right knee.   Meds & Orders: No orders of the defined types were placed in this encounter.   Orders Placed This Encounter  Procedures   Large Joint Inj: R knee   XR Knee Complete 4 Views Right   [place] Extrem Low Right Ltd     Procedures: Large Joint Inj: R knee on 05/28/2022 2:02 PM Details: 22 G 1.5 in needle, anterolateral approach Medications: 2 mL lidocaine 1 %; 2 mL bupivacaine 0.25 %; 80 mg methylPREDNISolone acetate 40 MG/ML Outcome: tolerated well, no immediate complications  Knee Injection, Right: After discussion on risks/benefits/indications, informed verbal consent was obtained and a timeout was performed, patient was seated on exam table. The patient's knee was prepped with Betadine and alcohol swab and utilizing anterolateral approach, the patient's knee was injected intraarticularly with 2:2:1 lidocaine 1%:bupivicaine 0.25%:depomedrol. Patient tolerated the procedure well without immediate complications.  Procedure, treatment alternatives, risks and benefits explained, specific risks discussed. Consent was given by the patient. Immediately prior to procedure a time out was called to verify the correct patient, procedure, equipment, support staff and site/side marked as required. Patient was prepped and draped in the usual sterile fashion.          Clinical History: No specialty comments available.  She reports that she quit smoking about 54 years ago. Her smoking use included cigarettes. She has never used smokeless tobacco. No results for input(s): "HGBA1C", "LABURIC" in the last 8760 hours.  Objective:    Physical Exam  Gen: Well-appearing, in no acute distress; non-toxic CV: Well-perfused. Warm.  Resp: Breathing unlabored on room air; no wheezing. Psych: Fluid speech in conversation; appropriate affect; normal thought process Neuro: Sensation intact throughout. No gross coordination  deficits.   Ortho Exam - Right knee: There is a small effusion noted over the knee without significant redness or erythema.  There is some patellofemoral pain upon deep palpation both laterally and medially.  No medial or lateral joint line TTP.  There is patellar crepitus noted.  Strength 5/5 throughout.  NVI.  Imaging: [place] Extrem Low Right Ltd  Result Date: 05/28/2022 Limited ultrasound of the right lower extremity, right knee was performed today.  Evaluation of the patella shows no cortical regularity, intact quadriceps tendon.  Suprapatellar pouch has a mild effusion that is slightly larger over the lateral side of the joint.  Patellar tendon intact with proper insertion from the patella to the tibial tubercle without abnormality.  Lateral joint line shows a small effusion.  XR Knee Complete 4 Views Right  Result Date: 05/28/2022 4 views of the right knee including bilateral AP standing, Rosenberg, lateral and sunrise views were ordered and reviewed by myself.  X-rays demonstrate very minimal medial tibiofemoral joint space narrowing.  There is mild to moderate patellofemoral arthritic change.  There is lateral patellar tracking noted on sunrise views.  No acute fracture noted.   Past Medical/Family/Surgical/Social History: Medications & Allergies reviewed per EMR, new medications updated. Patient Active Problem List   Diagnosis Date Noted   Paresthesia 08/01/2020   TIA (transient ischemic attack) 08/01/2020   Pain of right hip joint 01/20/2017   Right hip pain 12/30/2016   Dizziness    HA (headache)    HYPERLIPIDEMIA 10/04/2009   HYPERGLYCEMIA 06/28/2009   Essential hypertension 10/14/2006   Headache(784.0) 10/14/2006   Overweight(278.02) 02/04/2006   Past Medical History:  Diagnosis Date   Anal fissure    Anxiety    Chest pain    r/t anxiety/panic attacks   Diverticulitis    Diverticulosis    Dizziness    Hx in past - now resolved   GERD (gastroesophageal reflux disease)     diet controlled, no med   HA (headache)    Hyperlipidemia    diet controlled, no med   Hypertension    IBS (irritable bowel syndrome)    SOB (shortness of breath)    on occasion - resolved per patient   Family History  Problem Relation Age of Onset   Diabetes Mother    Hypertension Mother    Transient ischemic attack Mother    Colon cancer Father 14   Rectal cancer Neg Hx    Stomach cancer Neg Hx    Esophageal cancer Neg Hx    Breast cancer Neg Hx    Past Surgical History:  Procedure Laterality Date   ABDOMINAL HYSTERECTOMY     ovaries remain   APPENDECTOMY     BREAST EXCISIONAL BIOPSY Left 2006   BREAST SURGERY     COLONOSCOPY     HIP SURGERY     LAPAROSCOPY     NECK SURGERY     cyst removed from neck   RECTAL SURGERY     thromosis/fissure   TONSILECTOMY, ADENOIDECTOMY, BILATERAL MYRINGOTOMY AND TUBES     Social History   Occupational History   Occupation: Field seismologist: [employer]    Comment: [employer]  Tobacco Use   Smoking status: Former    Types: Cigarettes  Quit date: 04/02/1968    Years since quitting: 54.1   Smokeless tobacco: Never  Substance and Sexual Activity   Alcohol use: Yes    Alcohol/week: 3.0 standard drinks of alcohol    Types: 3 Glasses of wine per week    Comment: occasionally   Drug use: No   Sexual activity: Yes    Birth control/protection: Other-see comments    Comment: Hysterectomy

## 2022-05-29 ENCOUNTER — Other Ambulatory Visit: Payer: Self-pay

## 2022-05-29 ENCOUNTER — Other Ambulatory Visit: Payer: Self-pay | Admitting: Neurology

## 2022-05-29 MED ORDER — ROSUVASTATIN CALCIUM 5 MG PO TABS: ORAL_TABLET | ORAL | 0 refills | Status: DC

## 2022-05-29 NOTE — Telephone Encounter (Signed)
Sent in refill

## 2022-06-12 ENCOUNTER — Ambulatory Visit: Payer: BC Managed Care – PPO | Admitting: Orthopaedic Surgery

## 2022-06-27 ENCOUNTER — Other Ambulatory Visit: Payer: Self-pay | Admitting: Neurology

## 2022-06-27 ENCOUNTER — Ambulatory Visit: Payer: BC Managed Care – PPO | Admitting: Sports Medicine

## 2022-08-22 DIAGNOSIS — I10 Essential (primary) hypertension: Secondary | ICD-10-CM | POA: Diagnosis not present

## 2022-08-22 DIAGNOSIS — G8194 Hemiplegia, unspecified affecting left nondominant side: Secondary | ICD-10-CM | POA: Diagnosis not present

## 2022-08-22 DIAGNOSIS — R7309 Other abnormal glucose: Secondary | ICD-10-CM | POA: Diagnosis not present

## 2022-08-22 DIAGNOSIS — I6381 Other cerebral infarction due to occlusion or stenosis of small artery: Secondary | ICD-10-CM | POA: Diagnosis not present

## 2022-08-22 DIAGNOSIS — E785 Hyperlipidemia, unspecified: Secondary | ICD-10-CM | POA: Diagnosis not present

## 2022-08-23 ENCOUNTER — Other Ambulatory Visit: Payer: Self-pay | Admitting: Neurology

## 2022-08-23 MED ORDER — ROSUVASTATIN CALCIUM 5 MG PO TABS: ORAL_TABLET | ORAL | 0 refills | Status: AC

## 2022-08-29 DIAGNOSIS — R7309 Other abnormal glucose: Secondary | ICD-10-CM | POA: Diagnosis not present

## 2022-08-29 DIAGNOSIS — G8194 Hemiplegia, unspecified affecting left nondominant side: Secondary | ICD-10-CM | POA: Diagnosis not present

## 2022-08-29 DIAGNOSIS — I10 Essential (primary) hypertension: Secondary | ICD-10-CM | POA: Diagnosis not present

## 2022-08-29 DIAGNOSIS — Z0000 Encounter for general adult medical examination without abnormal findings: Secondary | ICD-10-CM | POA: Diagnosis not present

## 2022-09-02 ENCOUNTER — Other Ambulatory Visit: Payer: Self-pay | Admitting: Neurology

## 2022-09-21 ENCOUNTER — Other Ambulatory Visit: Payer: Self-pay | Admitting: Neurology

## 2022-09-25 NOTE — Progress Notes (Unsigned)
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  LDL goal less than 70 - explained that further refills should be prescribed by her PCP Normotensive blood pressure Follow up 9 months    Subjective:  [name] is a 62 year old right-handed female with HTN, HLD, IBS, GERD who follows up for thalamic pain syndrome.  UPDATE: Current medications:  gabapentin 900mg  TID, noirtriptyline 10mg  at bedtime, asa 81mg , metoprolol, rosuvastatin 5mg  daily (has been out for a month)   Started nortriptyline to help optimize pain control.  She stopped it because it did not make her feel well.  However, she feels that the gabapentin is now managing the pain well.  Sometimes at night, it may bother her.      HISTORY: At 37 PM on 07/30/2020, she was sitting up in bed watching TIV when she developed sudden onset numbness and tingling of the entire left arm. About 15 minutes later, it progressed to the lateral left leg and left side of her head and face.  She had a left occipital headache but no associated weakness or speech disturbance.  EMS was called.  Blood sugar was normal but systolic blood pressure was elevated in the 190s.  She was brought to [facility] ED where CT head was negative for acute stroke.  MRI of brain showed mild chronic small vessel ischemic changes but no acute stroke.  She was treated for a headache cocktail which helped with the headache but the numbness persisted.  She was discharged on gabapentin.  She followed up with outpatient neurology, Dr. Levert Feinstein, a couple of days later.  MRI-negative right thalamic stroke was suspected.  Echocardiogram  on 08/25/2020 showed EF 60-65% with no cardiac source of embolus.  Carotid ultrasound showed no hemodynamically significant stenosis.  Holter monitor did not reveal a fib.  She was started on ASA 81mg  daily.    Past medications:  duloxetine 60mg  daily (side effects), pregablin (side effects), nortriptyline (side effects)  PAST MEDICAL HISTORY: Past Medical History:  Diagnosis Date   Anal fissure    Anxiety    Chest pain    r/t anxiety/panic attacks   Diverticulitis    Diverticulosis    Dizziness    Hx in past - now resolved   GERD (gastroesophageal reflux disease)    diet controlled, no med   HA (headache)    Hyperlipidemia    diet controlled, no med   Hypertension    IBS (irritable bowel syndrome)    SOB (shortness of breath)    on occasion - resolved per patient    MEDICATIONS: Current Outpatient Medications on File Prior to Visit  Medication Sig Dispense Refill   Elastic Bandages & Supports (TRUFORM ARM SLEEVE M 15-20MMHG) MISC 1 each by Does not apply route daily. 1 each 0   fluconazole (DIFLUCAN) 150 MG tablet Take 150 mg by mouth once.     gabapentin (NEURONTIN) 300 MG capsule TAKE 3 CAPSULES(900 MG) BY MOUTH THREE TIMES DAILY 270 capsule 0   metoprolol tartrate (LOPRESSOR) 25 MG tablet Take 25 mg by mouth 2 (two) times daily.     nortriptyline (PAMELOR) 10 MG capsule Take 1 capsule (10 mg total) by  mouth at bedtime. 30 capsule 5   ondansetron (ZOFRAN) 4 MG tablet Take 1 tablet (4 mg total) by mouth every 8 (eight) hours as needed for nausea or vomiting. 20 tablet 0   rosuvastatin (CRESTOR) 5 MG tablet TAKE 1 TABLET(5 MG) BY MOUTH DAILY 30 tablet 0   No current facility-administered medications on file prior to visit.    ALLERGIES: Allergies  Allergen Reactions   Amlodipine Besylate     REACTION: headache   Codeine     Other reaction(s): Vertigo   Codeine Sulfate Other (See Comments)    intolerance   Lisinopril Swelling   Lisinopril-Hydrochlorothiazide      REACTION: swelling lips/angioedema   Meperidine Hcl Other (See Comments)    Passed out    Vicodin [Hydrocodone-Acetaminophen]     GI upset   Tape Rash    Patient reports unsure if from the adhesive or latex    FAMILY HISTORY: Family History  Problem Relation Age of Onset   Diabetes Mother    Hypertension Mother    Transient ischemic attack Mother    Colon cancer Father 19   Rectal cancer Neg Hx    Stomach cancer Neg Hx    Esophageal cancer Neg Hx    Breast cancer Neg Hx       Objective:  Blood pressure (!) 143/82, pulse (!) 57, height 5\' 6"  (1.676 m), weight 186 lb 3.2 oz (84.5 kg), SpO2 95%. General: No acute distress.  Patient appears well-groomed.   Head:  Normocephalic/atraumatic Neck:  Supple.  No paraspinal tenderness.  Full range of motion. Heart:  Regular rate and rhythm. Neuro:  Alert and oriented.  Speech fluent and not dysarthric.  Language intact.  CN II-XII intact.  Bulk and tone normal.  Muscle strength 5/5 throughout.  Sensation to pinprick decreased in left upper extremity, otherwise pinprick and vibration intact.  Deep tendon reflexes 2+ throughout.  Gait normal.  Romberg negative.     Shon Millet, DO  CC: Georgianne Fick, MD

## 2022-09-26 ENCOUNTER — Ambulatory Visit (INDEPENDENT_AMBULATORY_CARE_PROVIDER_SITE_OTHER): Payer: BC Managed Care – PPO | Admitting: Neurology

## 2022-09-26 ENCOUNTER — Encounter: Payer: Self-pay | Admitting: Neurology

## 2022-09-26 VITALS — BP 143/82 | HR 57 | Ht 66.0 in | Wt 186.2 lb

## 2022-09-26 DIAGNOSIS — G890 Central pain syndrome: Secondary | ICD-10-CM

## 2022-09-26 DIAGNOSIS — I10 Essential (primary) hypertension: Secondary | ICD-10-CM | POA: Diagnosis not present

## 2022-09-26 DIAGNOSIS — E785 Hyperlipidemia, unspecified: Secondary | ICD-10-CM | POA: Diagnosis not present

## 2022-09-26 DIAGNOSIS — I6381 Other cerebral infarction due to occlusion or stenosis of small artery: Secondary | ICD-10-CM | POA: Diagnosis not present

## 2022-09-26 MED ORDER — GABAPENTIN 300 MG PO CAPS: ORAL_CAPSULE | ORAL | 5 refills | Status: DC

## 2022-09-26 NOTE — Patient Instructions (Signed)
TAKE GABAPENTIN 900MG  THREE TIMES DAILY.  MAY TAKE EXTRA 300MG  AS NEEDED FOR BREAKTHROUGH PAIN

## 2022-10-06 ENCOUNTER — Other Ambulatory Visit: Payer: Self-pay | Admitting: Neurology

## 2022-11-15 ENCOUNTER — Other Ambulatory Visit: Payer: Self-pay | Admitting: Internal Medicine

## 2022-11-15 DIAGNOSIS — Z0000 Encounter for general adult medical examination without abnormal findings: Secondary | ICD-10-CM

## 2022-11-21 ENCOUNTER — Telehealth: Payer: Self-pay | Admitting: Gastroenterology

## 2022-11-21 DIAGNOSIS — R197 Diarrhea, unspecified: Secondary | ICD-10-CM | POA: Diagnosis not present

## 2022-11-21 DIAGNOSIS — K625 Hemorrhage of anus and rectum: Secondary | ICD-10-CM | POA: Diagnosis not present

## 2022-11-21 NOTE — Telephone Encounter (Signed)
Inbound call from patient stating she has been having rectal bleeding. States she has also been having weight loss due to anything she eats will not stay on her stomach. Patient did not wish to schedule for next available in January, wishing to speak with a nurse. Please advise, thank you.

## 2022-11-21 NOTE — Telephone Encounter (Signed)
Pt last seen in 2021 and calls with rectal bleeding, fecal incontinence, and weight loss.  She is not sure how much weight loss.  No appts available with any provider or Dr Russella Dar.  She was offered the fist available with Dr Rhea Belton on 02/07/23 and recommended she call her PCP for eval now.  I offered her the appt with Dr Rhea Belton but she declines and states she will f/u with PCP and call back if she wants to be seen here or she may find another GI practice.

## 2022-11-22 DIAGNOSIS — R197 Diarrhea, unspecified: Secondary | ICD-10-CM | POA: Diagnosis not present

## 2022-12-12 ENCOUNTER — Ambulatory Visit
Admission: RE | Admit: 2022-12-12 | Discharge: 2022-12-12 | Disposition: A | Discharge status: Home / Self Care | Payer: BC Managed Care – PPO | Source: Ambulatory Visit | Attending: Internal Medicine | Admitting: Internal Medicine

## 2022-12-12 DIAGNOSIS — Z1231 Encounter for screening mammogram for malignant neoplasm of breast: Secondary | ICD-10-CM | POA: Diagnosis not present

## 2022-12-12 DIAGNOSIS — Z0000 Encounter for general adult medical examination without abnormal findings: Secondary | ICD-10-CM

## 2023-01-23 ENCOUNTER — Encounter: Payer: Self-pay | Admitting: Neurology

## 2023-03-12 DIAGNOSIS — R221 Localized swelling, mass and lump, neck: Secondary | ICD-10-CM | POA: Diagnosis not present

## 2023-04-01 DIAGNOSIS — Z01419 Encounter for gynecological examination (general) (routine) without abnormal findings: Secondary | ICD-10-CM | POA: Diagnosis not present

## 2023-04-07 DIAGNOSIS — I10 Essential (primary) hypertension: Secondary | ICD-10-CM | POA: Diagnosis not present

## 2023-04-07 DIAGNOSIS — R7309 Other abnormal glucose: Secondary | ICD-10-CM | POA: Diagnosis not present

## 2023-04-07 DIAGNOSIS — E785 Hyperlipidemia, unspecified: Secondary | ICD-10-CM | POA: Diagnosis not present

## 2023-04-07 DIAGNOSIS — G8194 Hemiplegia, unspecified affecting left nondominant side: Secondary | ICD-10-CM | POA: Diagnosis not present

## 2023-04-14 DIAGNOSIS — R7309 Other abnormal glucose: Secondary | ICD-10-CM | POA: Diagnosis not present

## 2023-04-14 DIAGNOSIS — G8194 Hemiplegia, unspecified affecting left nondominant side: Secondary | ICD-10-CM | POA: Diagnosis not present

## 2023-04-14 DIAGNOSIS — R110 Nausea: Secondary | ICD-10-CM | POA: Diagnosis not present

## 2023-04-14 DIAGNOSIS — I10 Essential (primary) hypertension: Secondary | ICD-10-CM | POA: Diagnosis not present

## 2023-04-16 DIAGNOSIS — R110 Nausea: Secondary | ICD-10-CM | POA: Diagnosis not present

## 2023-04-16 DIAGNOSIS — N281 Cyst of kidney, acquired: Secondary | ICD-10-CM | POA: Diagnosis not present

## 2023-04-24 ENCOUNTER — Institutional Professional Consult (permissible substitution) (INDEPENDENT_AMBULATORY_CARE_PROVIDER_SITE_OTHER): Payer: BC Managed Care – PPO | Admitting: Otolaryngology

## 2023-05-12 ENCOUNTER — Other Ambulatory Visit: Payer: Self-pay | Admitting: Neurology

## 2023-06-02 DIAGNOSIS — R110 Nausea: Secondary | ICD-10-CM | POA: Diagnosis not present

## 2023-06-02 DIAGNOSIS — R748 Abnormal levels of other serum enzymes: Secondary | ICD-10-CM | POA: Diagnosis not present

## 2023-06-02 DIAGNOSIS — R7309 Other abnormal glucose: Secondary | ICD-10-CM | POA: Diagnosis not present

## 2023-06-02 DIAGNOSIS — G8194 Hemiplegia, unspecified affecting left nondominant side: Secondary | ICD-10-CM | POA: Diagnosis not present

## 2023-06-11 ENCOUNTER — Other Ambulatory Visit: Payer: Self-pay | Admitting: Neurology

## 2023-06-11 ENCOUNTER — Ambulatory Visit: Payer: BC Managed Care – PPO | Admitting: Neurology

## 2023-06-19 ENCOUNTER — Ambulatory Visit (INDEPENDENT_AMBULATORY_CARE_PROVIDER_SITE_OTHER): Admitting: Otolaryngology

## 2023-06-19 ENCOUNTER — Encounter (INDEPENDENT_AMBULATORY_CARE_PROVIDER_SITE_OTHER): Payer: Self-pay | Admitting: Otolaryngology

## 2023-06-19 VITALS — BP 168/97 | HR 85 | Ht 66.0 in | Wt 185.0 lb

## 2023-06-19 DIAGNOSIS — J343 Hypertrophy of nasal turbinates: Secondary | ICD-10-CM

## 2023-06-19 DIAGNOSIS — R110 Nausea: Secondary | ICD-10-CM

## 2023-06-19 DIAGNOSIS — J342 Deviated nasal septum: Secondary | ICD-10-CM

## 2023-06-19 DIAGNOSIS — R0982 Postnasal drip: Secondary | ICD-10-CM

## 2023-06-19 DIAGNOSIS — K219 Gastro-esophageal reflux disease without esophagitis: Secondary | ICD-10-CM

## 2023-06-19 DIAGNOSIS — R590 Localized enlarged lymph nodes: Secondary | ICD-10-CM

## 2023-06-19 NOTE — Patient Instructions (Signed)

## 2023-06-19 NOTE — Progress Notes (Signed)
 ENT CONSULT:  Reason for Consult: left supraclavicular area lump   HPI: Discussed the use of AI scribe software for clinical note transcription with the patient, who gave verbal consent to proceed.  History of Present Illness [name] is a 63 year old female who presents with a lump in her neck. She was referred by Dr. Lauralee Poll PCP for evaluation of a neck lump.  Approximately three to four months ago, she noticed a mobile lump in her left supraclavicular area, which was initially smaller in size.   She experienced unintentional weight loss and episodes of nausea that affect her ability to eat, sometimes lasting up to two days. During these episodes, she relies on Gatorade for hydration. She is on a medication to take 30 minutes before meals and another for nausea, which helps her tolerate food better.  A sonogram of her stomach performed two months ago showed a small finding in the liver, considered within normal range. She has not consulted a gastroenterologist for her nausea.  Her past medical history includes a stroke three years ago, resulting in numbness and tingling on her entire left side, from the top of her head to her ankle. She experiences significant pain in her left hand and is on gabapentin  for this. She is also on medication for high blood pressure. Recent lab work was normal per report (we don't have access to results for review).   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  Will start nortriptyline  10mg  at bedtime to optimize pain control. We can increase to 25mg  at bedtime in 4 weeks if needed.  If blood pressure becomes more elevated or difficult to control, she was advised to contact me and we would have to discontinue it.   Secondary stroke  prevention as managed by PCP: ASA 81mg  daily Rosuvastatin .  LDL goal less than 70 Normotensive blood pressure - follow up with PCP Follow up 4 to 5 months.    Past Medical History:  Diagnosis Date   Anal fissure    Anxiety    Chest pain    r/t anxiety/panic attacks   Diverticulitis    Diverticulosis    Dizziness    Hx in past - now resolved   GERD (gastroesophageal reflux disease)    diet controlled, no med   HA (headache)    Hyperlipidemia    diet controlled, no med   Hypertension    IBS (irritable bowel syndrome)    SOB (shortness of breath)    on occasion - resolved per patient    Past Surgical History:  Procedure Laterality Date   ABDOMINAL HYSTERECTOMY     ovaries remain   APPENDECTOMY     BREAST EXCISIONAL BIOPSY Left 2006   BREAST SURGERY     COLONOSCOPY     HIP SURGERY     LAPAROSCOPY     NECK SURGERY     cyst removed from neck   RECTAL SURGERY     thromosis/fissure   TONSILECTOMY, ADENOIDECTOMY, BILATERAL MYRINGOTOMY AND TUBES      Family History  Problem Relation Age of Onset   Diabetes Mother    Hypertension Mother    Transient ischemic attack Mother    Colon cancer Father 66   Rectal cancer Neg Hx    Stomach cancer Neg Hx    Esophageal cancer  Neg Hx    Breast cancer Neg Hx     Social History:  reports that she quit smoking about 55 years ago. Her smoking use included cigarettes. She has never used smokeless tobacco. She reports current alcohol use of about 3.0 standard drinks of alcohol per week. She reports that she does not use drugs.  Allergies:  Allergies  Allergen Reactions   Amlodipine Besylate     REACTION: headache   Codeine     Other reaction(s): Vertigo   Codeine Sulfate Other (See Comments)    intolerance   Lisinopril Swelling   Lisinopril-Hydrochlorothiazide      REACTION: swelling lips/angioedema   Meperidine Hcl Other (See Comments)    Passed out    Vicodin [Hydrocodone -Acetaminophen ]     GI upset   Tape Rash     Patient reports unsure if from the adhesive or latex    Medications: I have reviewed the patient's current medications.  The PMH, PSH, Medications, Allergies, and SH were reviewed and updated.  ROS: Constitutional: Negative for fever, weight loss and weight gain. Cardiovascular: Negative for chest pain and dyspnea on exertion. Respiratory: Is not experiencing shortness of breath at rest. Gastrointestinal: Negative for nausea and vomiting. Neurological: Negative for headaches. Psychiatric: The patient is not nervous/anxious  Blood pressure (!) 168/97, pulse 85, height 5\' 6"  (1.676 m), weight 185 lb (83.9 kg), SpO2 99%. Body mass index is 29.86 kg/m.  PHYSICAL EXAM:  Exam: General: Well-developed, well-nourished Respiratory Respiratory effort: Equal inspiration and expiration without stridor Cardiovascular Peripheral Vascular: Warm extremities with equal color/perfusion Eyes: No nystagmus with equal extraocular motion bilaterally Neuro/Psych/Balance: Patient oriented to person, place, and time; Appropriate mood and affect; Gait is intact with no imbalance; Cranial nerves I-XII are intact Head and Face Inspection: Normocephalic and atraumatic without mass or lesion Palpation: Facial skeleton intact without bony stepoffs Salivary Glands: No mass or tenderness Facial Strength: Facial motility symmetric and full bilaterally ENT Pinna: External ear intact and fully developed External canal: Canal is patent with intact skin Tympanic Membrane: Clear and mobile External Nose: No scar or anatomic deformity Internal Nose: Septum is S-shaped. No polyp, or purulence. Mucosal edema and erythema present.  Bilateral inferior turbinate hypertrophy.  Lips, Teeth, and gums: Mucosa and teeth intact and viable TMJ: No pain to palpation with full mobility Oral cavity/oropharynx: No erythema or exudate, no lesions present Nasopharynx: No mass or lesion with intact mucosa Hypopharynx: Intact  mucosa without pooling of secretions Larynx Glottic: Full true vocal cord mobility without lesion or mass Supraglottic: Normal appearing epiglottis and AE folds Interarytenoid Space: Moderate pachydermia&edema Subglottic Space: Patent without lesion or edema Neck Neck and Trachea: Midline trachea without mass or lesion Thyroid : No mass or nodularity Lymphatics: No lymphadenopathy left supraclavicular area with 2-3 mm mobile knot vs node felt only with significant pressure, palpation  Procedure: Preoperative diagnosis: cervical lymphadenopathy  Postoperative diagnosis:   Same  Procedure: Flexible fiberoptic laryngoscopy  Surgeon: Artice Last, MD  Anesthesia: Topical lidocaine  and Afrin Complications: None Condition is stable throughout exam  Indications and consent:  The patient presents to the clinic with above symptoms. Indirect laryngoscopy view was incomplete. Thus it was recommended that they undergo a flexible fiberoptic laryngoscopy. All of the risks, benefits, and potential complications were reviewed with the patient preoperatively and verbal informed consent was obtained.  Procedure: The patient was seated upright in the clinic. Topical lidocaine  and Afrin were applied to the nasal cavity. After adequate anesthesia had occurred, I then proceeded to pass  the flexible telescope into the nasal cavity. The nasal cavity was patent without rhinorrhea or polyp. The nasopharynx was also patent without mass or lesion. The base of tongue was visualized and was normal. There were no signs of pooling of secretions in the piriform sinuses. The true vocal folds were mobile bilaterally. There were no signs of glottic or supraglottic mucosal lesion or mass. There was modreate interarytenoid pachydermia and post cricoid edema. The telescope was then slowly withdrawn and the patient tolerated the procedure throughout.    Studies Reviewed: 01/07/21 CT head IMPRESSION: 1. 12.1 x 7.7 x 7.2 mm  right thalamic lacunar infarct new from prior studies. Appears to be either chronic or late-subacute. No acute intracranial CT abnormality is seen. 2. Early cortical atrophy.  No further interval changes.    Assessment/Plan: Encounter Diagnoses  Name Primary?   Cervical lymphadenopathy Yes   Chronic GERD    Post-nasal drip    Nasal septal deviation    Hypertrophy of both inferior nasal turbinates     Assessment and Plan Assessment & Plan Lump/node left supraclavicular area  Mobile lump in left supraclavicular area, likely lymph node or lipoma. Unlikely related to head and neck pathology due to location. Flexible scope exam and head and neck exam today reassuring.  - Order CT scan of neck w/con for characterization and baseline. - Schedule follow-up to review scan results.  GERD LPR Reflux changes observed during scope likely causing throat irritation. - Recommend seaweed-based natural remedy Reflux Gourmet post-meals to prevent reflux. - seeing PCP for nausea, no prior GI consult, consider GI consult in the future 2/2 GI sx, weight loss due to poor PO intake    Nausea with weight loss Intermittent nausea causing weight loss. Primary care treatment initiated with some improvement. Abd sonogram normal per report. GI evaluation if symptoms persist.  Stroke with left-sided numbness Stroke with persistent left-sided numbness and pain in left hand. Managed with gabapentin .  CT neck RTC 2 mo  Thank you for allowing me to participate in the care of this patient. Please do not hesitate to contact me with any questions or concerns.   Artice Last, MD Otolaryngology [facility] Phone: [phone] Fax: [phone]    06/19/2023, 9:23 AM

## 2023-06-26 ENCOUNTER — Ambulatory Visit: Payer: BC Managed Care – PPO | Admitting: Neurology

## 2023-07-03 ENCOUNTER — Ambulatory Visit
Admission: RE | Admit: 2023-07-03 | Discharge: 2023-07-03 | Disposition: A | Discharge status: Home / Self Care | Source: Ambulatory Visit | Attending: Otolaryngology | Admitting: Otolaryngology

## 2023-07-03 DIAGNOSIS — R590 Localized enlarged lymph nodes: Secondary | ICD-10-CM

## 2023-07-03 MED ORDER — IOPAMIDOL (ISOVUE-300) INJECTION 61%
75.0000 mL | Freq: Once | INTRAVENOUS | Status: AC | PRN
Start: 2023-07-03 — End: 2023-07-03
  Administered 2023-07-03: 75 mL via INTRAVENOUS

## 2023-07-07 NOTE — Progress Notes (Signed)
 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  LDL goal less than 70  Normotensive blood pressure Follow up one year   Subjective:  [name] is a 63 year old right-handed female with HTN, HLD, IBS, GERD who follows up for thalamic pain syndrome.  UPDATE: Current medications:  gabapentin  900mg  TID, asa 81mg , metoprolol , rosuvastatin  20mg   Still with pain in the left hand up to the elbow like a squeezing pain.  However, overall pain and discomfort is much better.      HISTORY: At 25 PM on 07/30/2020, she was sitting up in bed watching TIV when she developed sudden onset numbness and tingling of the entire left arm. About 15 minutes later, it progressed to the lateral left leg and left side of her head and face.  She had a left occipital headache but no associated weakness or speech disturbance.  EMS was called.  Blood sugar was normal but systolic blood pressure was elevated in the 190s.  She was brought to [facility] ED where CT head was negative for acute stroke.  MRI of brain showed mild chronic small vessel ischemic changes but no acute stroke.  She was treated for a headache cocktail which helped with the headache but the numbness persisted.  She was discharged on gabapentin .  She followed up with outpatient neurology, Dr. Phebe Brasil, a couple of days later.  MRI-negative right thalamic stroke was suspected.  Echocardiogram on 08/25/2020 showed EF 60-65% with no cardiac source of embolus.  Carotid ultrasound showed no hemodynamically significant stenosis.  Holter monitor did not reveal a fib.  She was started on ASA 81mg  daily.    Past medications:  duloxetine  60mg  daily  (side effects), pregablin (side effects), nortriptyline  (side effects)  PAST MEDICAL HISTORY: Past Medical History:  Diagnosis Date   Anal fissure    Anxiety    Chest pain    r/t anxiety/panic attacks   Diverticulitis    Diverticulosis    Dizziness    Hx in past - now resolved   GERD (gastroesophageal reflux disease)    diet controlled, no med   HA (headache)    Hyperlipidemia    diet controlled, no med   Hypertension    IBS (irritable bowel syndrome)    SOB (shortness of breath)    on occasion - resolved per patient    MEDICATIONS: Current Outpatient Medications on File Prior to Visit  Medication Sig Dispense Refill   Elastic Bandages & Supports (TRUFORM ARM SLEEVE M 15-20MMHG) MISC 1 each by Does not apply route daily. 1 each 0   fluconazole (DIFLUCAN) 150 MG tablet Take 150 mg by mouth once.     gabapentin  (NEURONTIN ) 300 MG capsule TAKE 3 CAPSULES(900 MG) BY MOUTH THREE TIMES DAILY 270 capsule 0   metoprolol  tartrate (LOPRESSOR ) 25 MG tablet Take 25 mg by mouth 2 (two) times daily.     rosuvastatin  (CRESTOR ) 5 MG tablet TAKE 1 TABLET(5 MG) BY MOUTH DAILY 30 tablet 0   No current facility-administered medications on file prior to visit.    ALLERGIES: Allergies  Allergen Reactions   Amlodipine Besylate     REACTION: headache   Codeine     Other reaction(s): Vertigo   Codeine  Sulfate Other (See Comments)    intolerance   Lisinopril Swelling   Lisinopril-Hydrochlorothiazide      REACTION: swelling lips/angioedema   Meperidine Hcl Other (See Comments)    Passed out    Vicodin [Hydrocodone -Acetaminophen ]     GI upset   Tape Rash    Patient reports unsure if from the adhesive or latex    FAMILY HISTORY: Family History  Problem Relation Age of Onset   Diabetes Mother    Hypertension Mother    Transient ischemic attack Mother    Colon cancer Father 48   Rectal cancer Neg Hx    Stomach cancer Neg Hx    Esophageal cancer Neg Hx    Breast cancer Neg Hx        Objective:  Blood pressure 134/81, pulse (!) 54, height 5' 6.5" (1.689 m), weight 179 lb (81.2 kg), SpO2 97%. General: No acute distress.  Patient appears well-groomed.   Head:  Normocephalic/atraumatic Neck:  Supple.  No paraspinal tenderness.  Full range of motion. Heart:  Regular rate and rhythm. Neuro:  Alert and oriented.  Speech fluent and not dysarthric.  Language intact.  CN II-XII intact.  Bulk and tone normal.  Muscle strength 5/5 throughout.  Sensation to pinprick reduced in left upper extremity; vibratory sensation intact.  Deep tendon reflexes 2+ throughout, toes downgoing.  Gait normal.  Romberg negative.      Janne Members, DO  CC: Virgle Grime, MD

## 2023-07-08 ENCOUNTER — Encounter: Payer: Self-pay | Admitting: Neurology

## 2023-07-08 ENCOUNTER — Ambulatory Visit (INDEPENDENT_AMBULATORY_CARE_PROVIDER_SITE_OTHER): Admitting: Neurology

## 2023-07-08 VITALS — BP 134/81 | HR 54 | Ht 66.5 in | Wt 179.0 lb

## 2023-07-08 DIAGNOSIS — E785 Hyperlipidemia, unspecified: Secondary | ICD-10-CM | POA: Diagnosis not present

## 2023-07-08 DIAGNOSIS — G890 Central pain syndrome: Secondary | ICD-10-CM | POA: Diagnosis not present

## 2023-07-08 DIAGNOSIS — I6381 Other cerebral infarction due to occlusion or stenosis of small artery: Secondary | ICD-10-CM | POA: Diagnosis not present

## 2023-07-08 DIAGNOSIS — I10 Essential (primary) hypertension: Secondary | ICD-10-CM | POA: Diagnosis not present

## 2023-07-08 MED ORDER — GABAPENTIN 300 MG PO CAPS: ORAL_CAPSULE | ORAL | 5 refills | Status: DC

## 2023-07-08 NOTE — Patient Instructions (Signed)
 Continue gabapentin  900mg  three times daily

## 2023-07-12 ENCOUNTER — Other Ambulatory Visit: Payer: Self-pay | Admitting: Neurology

## 2023-07-31 ENCOUNTER — Ambulatory Visit (INDEPENDENT_AMBULATORY_CARE_PROVIDER_SITE_OTHER): Payer: Self-pay

## 2023-07-31 NOTE — Telephone Encounter (Signed)
 Spoke with patient about CT results. Explained that Dr. Soldatova also wanted her to come in for a follow-up to further discuss her results. Sent patient to the front to schedule appointment.

## 2023-08-04 ENCOUNTER — Encounter (INDEPENDENT_AMBULATORY_CARE_PROVIDER_SITE_OTHER): Payer: Self-pay | Admitting: Otolaryngology

## 2023-08-04 ENCOUNTER — Ambulatory Visit (INDEPENDENT_AMBULATORY_CARE_PROVIDER_SITE_OTHER): Admitting: Otolaryngology

## 2023-08-04 VITALS — BP 134/89 | HR 57

## 2023-08-04 DIAGNOSIS — R590 Localized enlarged lymph nodes: Secondary | ICD-10-CM

## 2023-08-04 NOTE — Progress Notes (Signed)
 ENT Progress Note:   Update 08/04/2023  Discussed the use of AI scribe software for clinical note transcription with the patient, who gave verbal consent to proceed.  History of Present Illness [name] is a 63 year old female who presents with enlarged lymph nodes in the neck.  She has enlarged lymph nodes in her neck left side, CT showed approximately one centimeter level 4-5 nodes, with one extending into the upper mediastinal space. The findings were non-specific and likely reactive. No associated symptoms such as fever, chills, or weight loss are present.   Records Reviewed:  Initial Evaluation  Reason for Consult: left supraclavicular area lump   HPI: Discussed the use of AI scribe software for clinical note transcription with the patient, who gave verbal consent to proceed.  History of Present Illness [name] is a 63 year old female who presents with a lump in her neck. She was referred by Dr. Melisa PCP for evaluation of a neck lump.  Approximately three to four months ago, she noticed a mobile lump in her left supraclavicular area, which was initially smaller in size.   She experienced unintentional weight loss and episodes of nausea that affect her ability to eat, sometimes lasting up to two days. During these episodes, she relies on Gatorade for hydration. She is on a medication to take 30 minutes before meals and another for nausea, which helps her tolerate food better.  A sonogram of her stomach performed two months ago showed a small finding in the liver, considered within normal range. She has not consulted a gastroenterologist for her nausea.  Her past medical history includes a stroke three years ago, resulting in numbness and tingling on her entire left side, from the top of her head to her ankle. She experiences significant pain in her left hand and is on gabapentin  for this. She is also on medication for high blood pressure. Recent lab work was normal  per report (we don't have access to results for review).      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  Will start nortriptyline  10mg  at bedtime to optimize pain control. We can increase to 25mg  at bedtime in 4 weeks if needed.  If blood pressure becomes more elevated or difficult to control, she was advised to contact me and we would have to discontinue it.   Secondary stroke prevention as managed by PCP: ASA 81mg  daily Rosuvastatin .  LDL goal less than 70 Normotensive blood pressure - follow up with PCP Follow up 4 to 5 months.    Past Medical History:  Diagnosis Date   Anal fissure    Anxiety    Chest pain    r/t anxiety/panic attacks   Diverticulitis    Diverticulosis    Dizziness    Hx in past - now resolved   GERD (gastroesophageal reflux disease)    diet controlled, no med   HA (headache)    Hyperlipidemia    diet controlled, no med   Hypertension    IBS (irritable bowel syndrome)    SOB (shortness of breath)    on occasion - resolved per patient    Past Surgical History:  Procedure Laterality Date   ABDOMINAL HYSTERECTOMY     ovaries remain   APPENDECTOMY     BREAST EXCISIONAL BIOPSY Left 2006  BREAST SURGERY     COLONOSCOPY     HIP SURGERY     LAPAROSCOPY     NECK SURGERY     cyst removed from neck   RECTAL SURGERY     thromosis/fissure   TONSILECTOMY, ADENOIDECTOMY, BILATERAL MYRINGOTOMY AND TUBES      Family History  Problem Relation Age of Onset   Diabetes Mother    Hypertension Mother    Transient ischemic attack Mother    Colon cancer Father 65   Rectal cancer Neg Hx    Stomach cancer Neg Hx    Esophageal cancer Neg Hx    Breast cancer Neg Hx     Social History:  reports that she quit smoking about 55 years ago. Her  smoking use included cigarettes. She has never used smokeless tobacco. She reports current alcohol use of about 3.0 standard drinks of alcohol per week. She reports that she does not use drugs.  Allergies:  Allergies  Allergen Reactions   Amlodipine Besylate     REACTION: headache   Codeine     Other reaction(s): Vertigo   Codeine Sulfate Other (See Comments)    intolerance   Lisinopril Swelling   Lisinopril-Hydrochlorothiazide      REACTION: swelling lips/angioedema   Meperidine Hcl Other (See Comments)    Passed out    Vicodin [Hydrocodone -Acetaminophen ]     GI upset   Tape Rash    Patient reports unsure if from the adhesive or latex    Medications: I have reviewed the patient's current medications.  The PMH, PSH, Medications, Allergies, and SH were reviewed and updated.  ROS: Constitutional: Negative for fever, weight loss and weight gain. Cardiovascular: Negative for chest pain and dyspnea on exertion. Respiratory: Is not experiencing shortness of breath at rest. Gastrointestinal: Negative for nausea and vomiting. Neurological: Negative for headaches. Psychiatric: The patient is not nervous/anxious  There were no vitals taken for this visit. There is no height or weight on file to calculate BMI.  PHYSICAL EXAM:  Exam: General: Well-developed, well-nourished Respiratory Respiratory effort: Equal inspiration and expiration without stridor Cardiovascular Peripheral Vascular: Warm extremities with equal color/perfusion Eyes: No nystagmus with equal extraocular motion bilaterally Neuro/Psych/Balance: Patient oriented to person, place, and time; Appropriate mood and affect; Gait is intact with no imbalance; Cranial nerves I-XII are intact Head and Face Inspection: Normocephalic and atraumatic without mass or lesion Palpation: Facial skeleton intact without bony stepoffs Salivary Glands: No mass or tenderness Facial Strength: Facial motility symmetric and full  bilaterally ENT Pinna: External ear intact and fully developed External canal: Canal is patent with intact skin Tympanic Membrane: Clear and mobile External Nose: No scar or anatomic deformity Lips, Teeth, and gums: Mucosa and teeth intact and viable TMJ: No pain to palpation with full mobility Oral cavity/oropharynx: No erythema or exudate, no lesions present Neck Neck and Trachea: Midline trachea without mass or lesion Thyroid : No mass or nodularity Lymphatics: No lymphadenopathy left supraclavicular area with 2-3 mm mobile knot vs node felt only with significant pressure, palpation  Studies Reviewed: 01/07/21 CT head IMPRESSION: 1. 12.1 x 7.7 x 7.2 mm right thalamic lacunar infarct new from prior studies. Appears to be either chronic or late-subacute. No acute intracranial CT abnormality is seen. 2. Early cortical atrophy.  No further interval changes.    CT neck 07/03/23 IMPRESSION: Prominent left levels 4 and 5 as well as superior mediastinal lymph nodes measuring up to 1.1 cm. There is also mild infiltration in the fat in the left  supraclavicular space. Findings are nonspecific and could be reactive or neoplastic. Three-month follow-up recommended.   Assessment/Plan: No diagnosis found.   Assessment and Plan Assessment & Plan Lump/node left supraclavicular area  Mobile lump in left supraclavicular area, likely lymph node or lipoma. Unlikely related to head and neck pathology due to location. Flexible scope exam and head and neck exam today reassuring.  - Order CT scan of neck w/con for characterization and baseline. - Schedule follow-up to review scan results.  GERD LPR Reflux changes observed during scope likely causing throat irritation. - Recommend seaweed-based natural remedy Reflux Gourmet post-meals to prevent reflux. - seeing PCP for nausea, no prior GI consult, consider GI consult in the future 2/2 GI sx, weight loss due to poor PO intake    Nausea with weight  loss Intermittent nausea causing weight loss. Primary care treatment initiated with some improvement. Abd sonogram normal per report. GI evaluation if symptoms persist.  Stroke with left-sided numbness Stroke with persistent left-sided numbness and pain in left hand. Managed with gabapentin .  CT neck RTC 2 mo  Update 08/04/23  Cervical lymphadenopathy Small, nonspecific, potentially reactive lymph nodes in the neck, one extending into the upper mediastinal space. Nodes approximately 1 cm, likely reactive. No red flag sx. Has upcoming labs ordered by PCP. No masses otherwise on the scan on flexible scope exam done last time she was here - Order repeat CT scan in 3-4 months to monitor lymph nodes. - Schedule follow-up appointment after repeat CT scan.    CT neck in 3 mo for interval imaging and RTC to discuss  Elena Larry, MD Otolaryngology [facility] Phone: [phone] Fax: [phone]    08/04/2023, 8:07 AM

## 2023-09-11 ENCOUNTER — Telehealth (INDEPENDENT_AMBULATORY_CARE_PROVIDER_SITE_OTHER): Payer: Self-pay

## 2023-09-11 NOTE — Telephone Encounter (Signed)
 LVM for patient to give us  a call back regarding CT.

## 2023-09-15 DIAGNOSIS — R748 Abnormal levels of other serum enzymes: Secondary | ICD-10-CM | POA: Diagnosis not present

## 2023-09-15 DIAGNOSIS — I10 Essential (primary) hypertension: Secondary | ICD-10-CM | POA: Diagnosis not present

## 2023-09-15 DIAGNOSIS — R7309 Other abnormal glucose: Secondary | ICD-10-CM | POA: Diagnosis not present

## 2023-09-15 DIAGNOSIS — E785 Hyperlipidemia, unspecified: Secondary | ICD-10-CM | POA: Diagnosis not present

## 2023-09-15 DIAGNOSIS — I6381 Other cerebral infarction due to occlusion or stenosis of small artery: Secondary | ICD-10-CM | POA: Diagnosis not present

## 2023-09-15 DIAGNOSIS — G8194 Hemiplegia, unspecified affecting left nondominant side: Secondary | ICD-10-CM | POA: Diagnosis not present

## 2023-09-18 ENCOUNTER — Telehealth (INDEPENDENT_AMBULATORY_CARE_PROVIDER_SITE_OTHER): Payer: Self-pay | Admitting: Otolaryngology

## 2023-09-18 NOTE — Telephone Encounter (Signed)
 LVM for patient to call to schedule followup appointment for the w/o 09/22/2023

## 2023-09-22 ENCOUNTER — Telehealth (INDEPENDENT_AMBULATORY_CARE_PROVIDER_SITE_OTHER): Payer: Self-pay | Admitting: Otolaryngology

## 2023-09-22 NOTE — Telephone Encounter (Signed)
 Confirmed appt & location [id number] afm

## 2023-09-23 ENCOUNTER — Ambulatory Visit (INDEPENDENT_AMBULATORY_CARE_PROVIDER_SITE_OTHER): Admitting: Otolaryngology

## 2023-09-23 ENCOUNTER — Encounter (INDEPENDENT_AMBULATORY_CARE_PROVIDER_SITE_OTHER): Payer: Self-pay | Admitting: Otolaryngology

## 2023-09-23 VITALS — BP 145/88

## 2023-09-23 DIAGNOSIS — R590 Localized enlarged lymph nodes: Secondary | ICD-10-CM | POA: Diagnosis not present

## 2023-09-23 NOTE — Progress Notes (Signed)
 ENT Progress Note:   Update 09/23/2023 Discussed the use of AI scribe software for clinical note transcription with the patient, who gave verbal consent to proceed.  [name] is a 63 year old female who presents with a persistent neck lymph node left supraclavicular area.  She has a persistent cervical lymph node that was initially identified in a CT scan conducted in May, measuring 1.1 cm.  There are no significant changes in symptoms related to the lymph node. No pain, fever, or other systemic symptoms are present. No weight loss, no other sx such as rash or night sweats.  She was supposed to have repeat CT neck done, but never got a call from radiology.  Last OV History of Present Illness [name] is a 63 year old female who presents with enlarged lymph nodes in the neck.  She has enlarged lymph nodes in her neck left side, CT showed approximately one centimeter level 4-5 nodes, with one extending into the upper mediastinal space. The findings were non-specific and likely reactive. No associated symptoms such as fever, chills, or weight loss are present.   Records Reviewed:  Initial Evaluation  Reason for Consult: left supraclavicular area lump   HPI: Discussed the use of AI scribe software for clinical note transcription with the patient, who gave verbal consent to proceed.  History of Present Illness [name] is a 63 year old female who presents with a lump in her neck. She was referred by Dr. Melisa PCP for evaluation of a neck lump.  Approximately three to four months ago, she noticed a mobile lump in her left supraclavicular area, which was initially smaller in size.   She experienced unintentional weight loss and episodes of nausea that affect her ability to eat, sometimes lasting up to two days. During these episodes, she relies on Gatorade for hydration. She is on a medication to take 30 minutes before meals and another for nausea, which helps her  tolerate food better.  A sonogram of her stomach performed two months ago showed a small finding in the liver, considered within normal range. She has not consulted a gastroenterologist for her nausea.  Her past medical history includes a stroke three years ago, resulting in numbness and tingling on her entire left side, from the top of her head to her ankle. She experiences significant pain in her left hand and is on gabapentin  for this. She is also on medication for high blood pressure. Recent lab work was normal per report (we don't have access to results for review).   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  Will start nortriptyline  10mg  at bedtime to optimize pain control. We can increase to 25mg  at bedtime in 4 weeks if needed.  If blood pressure becomes more elevated or difficult to control, she was advised to contact me and we would have to discontinue it.   Secondary stroke prevention as managed by PCP: ASA 81mg  daily Rosuvastatin .  LDL goal less than 70 Normotensive blood pressure - follow up with PCP Follow up 4 to 5 months.    Past Medical History:  Diagnosis Date   Anal fissure    Anxiety    Chest pain    r/t anxiety/panic attacks   Diverticulitis    Diverticulosis    Dizziness    Hx in  past - now resolved   GERD (gastroesophageal reflux disease)    diet controlled, no med   HA (headache)    Hyperlipidemia    diet controlled, no med   Hypertension    IBS (irritable bowel syndrome)    SOB (shortness of breath)    on occasion - resolved per patient    Past Surgical History:  Procedure Laterality Date   ABDOMINAL HYSTERECTOMY     ovaries remain   APPENDECTOMY     BREAST EXCISIONAL BIOPSY Left 2006   BREAST SURGERY     COLONOSCOPY     HIP  SURGERY     LAPAROSCOPY     NECK SURGERY     cyst removed from neck   RECTAL SURGERY     thromosis/fissure   TONSILECTOMY, ADENOIDECTOMY, BILATERAL MYRINGOTOMY AND TUBES      Family History  Problem Relation Age of Onset   Diabetes Mother    Hypertension Mother    Transient ischemic attack Mother    Colon cancer Father 26   Rectal cancer Neg Hx    Stomach cancer Neg Hx    Esophageal cancer Neg Hx    Breast cancer Neg Hx     Social History:  reports that she quit smoking about 55 years ago. Her smoking use included cigarettes. She has never used smokeless tobacco. She reports current alcohol use of about 3.0 standard drinks of alcohol per week. She reports that she does not use drugs.  Allergies:  Allergies  Allergen Reactions   Amlodipine Besylate     REACTION: headache   Codeine     Other reaction(s): Vertigo   Codeine Sulfate Other (See Comments)    intolerance   Lisinopril Swelling   Lisinopril-Hydrochlorothiazide      REACTION: swelling lips/angioedema   Meperidine Hcl Other (See Comments)    Passed out    Vicodin [Hydrocodone -Acetaminophen ]     GI upset   Tape Rash    Patient reports unsure if from the adhesive or latex    Medications: I have reviewed the patient's current medications.  The PMH, PSH, Medications, Allergies, and SH were reviewed and updated.  ROS: Constitutional: Negative for fever, weight loss and weight gain. Cardiovascular: Negative for chest pain and dyspnea on exertion. Respiratory: Is not experiencing shortness of breath at rest. Gastrointestinal: Negative for nausea and vomiting. Neurological: Negative for headaches. Psychiatric: The patient is not nervous/anxious  Blood pressure (!) 145/88. There is no height or weight on file to calculate BMI.  PHYSICAL EXAM:  Exam: General: Well-developed, well-nourished Respiratory Respiratory effort: Equal inspiration and expiration without stridor Cardiovascular Peripheral Vascular:  Warm extremities with equal color/perfusion Eyes: No nystagmus with equal extraocular motion bilaterally Neuro/Psych/Balance: Patient oriented to person, place, and time; Appropriate mood and affect; Gait is intact with no imbalance; Cranial nerves I-XII are intact Head and Face Inspection: Normocephalic and atraumatic without mass or lesion Palpation: Facial skeleton intact without bony stepoffs Salivary Glands: No mass or tenderness Facial Strength: Facial motility symmetric and full bilaterally ENT Pinna: External ear intact and fully developed External canal: Canal is patent with intact skin Tympanic Membrane: Clear and mobile External Nose: No scar or anatomic deformity Lips, Teeth, and gums: Mucosa and teeth intact and viable TMJ: No pain to palpation with full mobility Oral cavity/oropharynx: No erythema or exudate, no lesions present Neck Neck and Trachea: Midline trachea without mass or lesion Thyroid : No mass or nodularity Lymphatics: No lymphadenopathy,in the left supraclavicular area with 2-3 mm mobile knot  that was felt before no new lymph nodes  Studies Reviewed: 01/07/21 CT head IMPRESSION: 1. 12.1 x 7.7 x 7.2 mm right thalamic lacunar infarct new from prior studies. Appears to be either chronic or late-subacute. No acute intracranial CT abnormality is seen. 2. Early cortical atrophy.  No further interval changes.    CT neck 07/03/23 IMPRESSION: Prominent left levels 4 and 5 as well as superior mediastinal lymph nodes measuring up to 1.1 cm. There is also mild infiltration in the fat in the left supraclavicular space. Findings are nonspecific and could be reactive or neoplastic. Three-month follow-up recommended.  FINDINGS: Pharynx and larynx: Normal. No mass or swelling.   Salivary glands: No inflammation, mass, or stone.   Thyroid : Normal.   Lymph nodes: Prominent left levels 4 and 5 as well as superior mediastinal lymph nodes measuring up to 1.1 cm. There is  also mild infiltration in the fat in the left supraclavicular space.   Vascular: Negative.   Limited intracranial: Negative.   Visualized orbits: Negative.   Mastoids and visualized paranasal sinuses: Clear.   Skeleton: Mild spondylitic changes in the cervical spine.   Upper chest: Negative.   Other: None.   IMPRESSION: Prominent left levels 4 and 5 as well as superior mediastinal lymph nodes measuring up to 1.1 cm. There is also mild infiltration in the fat in the left supraclavicular space. Findings are nonspecific and could be reactive or neoplastic. Three-month follow-up recommended.  Assessment/Plan: No diagnosis found.   Assessment and Plan Assessment & Plan Lump/node left supraclavicular area  Mobile lump in left supraclavicular area, likely lymph node or lipoma. Unlikely related to head and neck pathology due to location. Flexible scope exam and head and neck exam today reassuring.  - Order CT scan of neck w/con for characterization and baseline. - Schedule follow-up to review scan results.  GERD LPR Reflux changes observed during scope likely causing throat irritation. - Recommend seaweed-based natural remedy Reflux Gourmet post-meals to prevent reflux. - seeing PCP for nausea, no prior GI consult, consider GI consult in the future 2/2 GI sx, weight loss due to poor PO intake   Nausea with weight loss Intermittent nausea causing weight loss. Primary care treatment initiated with some improvement. Abd sonogram normal per report. GI evaluation if symptoms persist.  Stroke with left-sided numbness Stroke with persistent left-sided numbness and pain in left hand. Managed with gabapentin .  CT neck RTC 2 mo  Update 08/04/23  Cervical lymphadenopathy Small, nonspecific, potentially reactive lymph nodes in the neck, one extending into the upper mediastinal space. Nodes approximately 1 cm, likely reactive. No red flag sx. Has upcoming labs ordered by PCP. No masses  otherwise on the scan on flexible scope exam done last time she was here - Order repeat CT scan in 3-4 months to monitor lymph nodes. - Schedule follow-up appointment after repeat CT scan.  Update 09/23/23  Enlarged cervical lymph node  Ddx reactive node vs neoplastic. No B-type sx, no other findings on CT neck - Schedule repeat CT scan of the neck. - Consider biopsy if the node remains the same size or increases in size   CT neck in 3 mo for interval imaging and RTC to discuss  Elena Larry, MD Otolaryngology [facility] Phone: [phone] Fax: [phone]    09/23/2023, 9:16 AM

## 2023-10-01 ENCOUNTER — Ambulatory Visit (HOSPITAL_BASED_OUTPATIENT_CLINIC_OR_DEPARTMENT_OTHER)

## 2023-10-11 ENCOUNTER — Ambulatory Visit (HOSPITAL_BASED_OUTPATIENT_CLINIC_OR_DEPARTMENT_OTHER)
Admission: RE | Admit: 2023-10-11 | Discharge: 2023-10-11 | Disposition: A | Discharge status: Home / Self Care | Source: Ambulatory Visit | Attending: Otolaryngology | Admitting: Otolaryngology

## 2023-10-11 DIAGNOSIS — R590 Localized enlarged lymph nodes: Secondary | ICD-10-CM

## 2023-10-11 LAB — POCT I-STAT CREATININE: Creatinine, Ser: 1 mg/dL (ref 0.44–1.00)

## 2023-10-11 MED ORDER — IOHEXOL 300 MG/ML  SOLN
75.0000 mL | Freq: Once | INTRAMUSCULAR | Status: AC | PRN
  Administered 2023-10-11: 75 mL via INTRAVENOUS

## 2023-10-16 ENCOUNTER — Ambulatory Visit (INDEPENDENT_AMBULATORY_CARE_PROVIDER_SITE_OTHER): Payer: Self-pay

## 2023-10-16 NOTE — Telephone Encounter (Signed)
 Spoke with patient regarding results after leaving VM. Patient understood.

## 2023-10-17 DIAGNOSIS — Z0000 Encounter for general adult medical examination without abnormal findings: Secondary | ICD-10-CM | POA: Diagnosis not present

## 2023-10-17 DIAGNOSIS — Z23 Encounter for immunization: Secondary | ICD-10-CM | POA: Diagnosis not present

## 2023-10-17 DIAGNOSIS — G8194 Hemiplegia, unspecified affecting left nondominant side: Secondary | ICD-10-CM | POA: Diagnosis not present

## 2023-10-17 DIAGNOSIS — R7309 Other abnormal glucose: Secondary | ICD-10-CM | POA: Diagnosis not present

## 2023-10-17 DIAGNOSIS — I10 Essential (primary) hypertension: Secondary | ICD-10-CM | POA: Diagnosis not present

## 2023-11-19 ENCOUNTER — Other Ambulatory Visit: Payer: Self-pay | Admitting: Internal Medicine

## 2023-11-19 DIAGNOSIS — Z1231 Encounter for screening mammogram for malignant neoplasm of breast: Secondary | ICD-10-CM

## 2023-11-26 ENCOUNTER — Telehealth (INDEPENDENT_AMBULATORY_CARE_PROVIDER_SITE_OTHER): Payer: Self-pay

## 2023-11-26 NOTE — Telephone Encounter (Signed)
 Left vm to r/s 12/18/23 appointment

## 2023-12-10 ENCOUNTER — Other Ambulatory Visit: Payer: Self-pay | Admitting: Medical Genetics

## 2023-12-11 DIAGNOSIS — R110 Nausea: Secondary | ICD-10-CM | POA: Diagnosis not present

## 2023-12-15 ENCOUNTER — Ambulatory Visit
Admission: RE | Admit: 2023-12-15 | Discharge: 2023-12-15 | Disposition: A | Source: Ambulatory Visit | Attending: Internal Medicine | Admitting: Internal Medicine

## 2023-12-15 DIAGNOSIS — Z1231 Encounter for screening mammogram for malignant neoplasm of breast: Secondary | ICD-10-CM

## 2023-12-18 ENCOUNTER — Ambulatory Visit (INDEPENDENT_AMBULATORY_CARE_PROVIDER_SITE_OTHER): Admitting: Otolaryngology

## 2024-01-19 ENCOUNTER — Other Ambulatory Visit

## 2024-01-19 DIAGNOSIS — Z006 Encounter for examination for normal comparison and control in clinical research program: Secondary | ICD-10-CM

## 2024-01-27 LAB — GENECONNECT MOLECULAR SCREEN

## 2024-02-02 ENCOUNTER — Ambulatory Visit
Admission: EM | Admit: 2024-02-02 | Discharge: 2024-02-02 | Disposition: A | Discharge status: Home / Self Care | Attending: Family Medicine | Admitting: Family Medicine

## 2024-02-02 ENCOUNTER — Ambulatory Visit: Payer: Self-pay | Admitting: Urgent Care

## 2024-02-02 ENCOUNTER — Ambulatory Visit: Admitting: Radiology

## 2024-02-02 DIAGNOSIS — M438X5 Other specified deforming dorsopathies, thoracolumbar region: Secondary | ICD-10-CM | POA: Diagnosis not present

## 2024-02-02 DIAGNOSIS — R9431 Abnormal electrocardiogram [ECG] [EKG]: Secondary | ICD-10-CM | POA: Diagnosis not present

## 2024-02-02 DIAGNOSIS — R079 Chest pain, unspecified: Secondary | ICD-10-CM

## 2024-02-02 DIAGNOSIS — R0781 Pleurodynia: Secondary | ICD-10-CM | POA: Diagnosis not present

## 2024-02-02 MED ORDER — CYCLOBENZAPRINE HCL 5 MG PO TABS: 5.0000 mg | ORAL_TABLET | Freq: Every evening | ORAL | 0 refills | Status: AC | PRN

## 2024-02-02 NOTE — ED Triage Notes (Signed)
 Pt reports left sided rib cage pain started this morning since she woke up. Pain is worse when taking a deep breath. Pt can not raise left ram due to pain.

## 2024-02-02 NOTE — ED Provider Notes (Signed)
 " [facility]  Note:  This document was prepared using Conservation officer, historic buildings and may include unintentional dictation errors.  MRN: [id number] DOB: [birth year]  Subjective:   [name] is a 63 y.o. female presenting for acute onset of moderate to severe left-sided lower and lateral chest pain.  No fever, cough, shortness of breath, heart racing, palpitations, diaphoresis, nausea, vomiting, left-sided neck or jaw pain, left arm pain.  No asthma.  She does have a history of chest pains thought to be related to anxiety or panic attacks.  Takes cholesterol medication.  No diabetes, smoking.  Current Outpatient Medications  Medication Instructions   Elastic Bandages & Supports (TRUFORM ARM SLEEVE M 15-20MMHG) MISC 1 each, Does not apply, Daily   fluconazole (DIFLUCAN) 150 mg,  Once   gabapentin  (NEURONTIN ) 300 MG capsule TAKE 3 CAPSULES(900 MG) BY MOUTH THREE TIMES DAILY   metoprolol  tartrate (LOPRESSOR ) 25 mg, 2 times daily   rosuvastatin  (CRESTOR ) 5 MG tablet TAKE 1 TABLET(5 MG) BY MOUTH DAILY   rosuvastatin  (CRESTOR ) 20 mg, Daily    Allergies[1]  Past Medical History:  Diagnosis Date   Anal fissure    Anxiety    Chest pain    r/t anxiety/panic attacks   Diverticulitis    Diverticulosis    Dizziness    Hx in past - now resolved   GERD (gastroesophageal reflux disease)    diet controlled, no med   HA (headache)    Hyperlipidemia    diet controlled, no med   Hypertension    IBS (irritable bowel syndrome)    SOB (shortness of breath)    on occasion - resolved per patient     Past Surgical History:  Procedure Laterality Date   ABDOMINAL HYSTERECTOMY     ovaries remain   APPENDECTOMY     BREAST EXCISIONAL BIOPSY Left 2006   BREAST SURGERY     COLONOSCOPY     HIP SURGERY     LAPAROSCOPY     NECK SURGERY     cyst removed from neck   RECTAL SURGERY     thromosis/fissure   TONSILECTOMY, ADENOIDECTOMY, BILATERAL MYRINGOTOMY AND TUBES       Family History  Problem Relation Age of Onset   Diabetes Mother    Hypertension Mother    Transient ischemic attack Mother    Colon cancer Father 3   Rectal cancer Neg Hx    Stomach cancer Neg Hx    Esophageal cancer Neg Hx    Breast cancer Neg Hx     Social History   Occupational History   Occupation: Field Seismologist: [employer]    Comment: [employer]  Tobacco Use   Smoking status: Former    Current packs/day: 0.00    Types: Cigarettes    Quit date: 04/02/1968    Years since quitting: 55.8   Smokeless tobacco: Never  Vaping Use   Vaping status: Never Used  Substance and Sexual Activity   Alcohol use: Yes    Alcohol/week: 3.0 standard drinks of alcohol    Types: 3 Glasses of wine per week    Comment: occasionally   Drug use: Never   Sexual activity: Yes    Birth control/protection: Other-see comments    Comment: Hysterectomy     ROS   Objective:   Vitals: BP (!) 159/90 (BP Location: Right Arm)   Pulse 62   Temp 99.1 F (37.3 C) (Oral)   Resp  16   SpO2 95%   Physical Exam Constitutional:      General: She is not in acute distress.    Appearance: Normal appearance. She is well-developed. She is not ill-appearing, toxic-appearing or diaphoretic.  HENT:     Head: Normocephalic and atraumatic.     Nose: Nose normal.     Mouth/Throat:     Mouth: Mucous membranes are moist.  Eyes:     General: No scleral icterus.       Right eye: No discharge.        Left eye: No discharge.     Extraocular Movements: Extraocular movements intact.  Cardiovascular:     Rate and Rhythm: Normal rate and regular rhythm.     Heart sounds: Normal heart sounds. No murmur heard.    No friction rub. No gallop.  Pulmonary:     Effort: Pulmonary effort is normal. No respiratory distress.     Breath sounds: No stridor. No wheezing, rhonchi or rales.  Chest:     Chest wall: No tenderness.  Skin:    General: Skin is warm and dry.  Neurological:      General: No focal deficit present.     Mental Status: She is alert and oriented to person, place, and time.  Psychiatric:        Mood and Affect: Mood normal.        Behavior: Behavior normal.    ED ECG REPORT   Date: 02/02/2024  EKG Time: 4:48 PM  Rate: 56bpm  Rhythm: sinus bradycardia and premature ventricular contractions (PVC)  Axis: normal  Intervals:none  ST&T Change: T wave inversion in aVL, V2  Narrative Interpretation: Sinus bradycardia with occasional premature ventricular contraction, nonspecific T wave inversion in leads as above.  Very comparable to prior EKGs except for the occasional PVC.   Assessment and Plan :   PDMP not reviewed this encounter.  1. Left-sided chest pain   2. Nonspecific abnormal electrocardiogram (ECG) (EKG)      Will pursue outpatient imaging as I do not have the radiology tech onsite.  Reviewed her abnormal EKG, premature ventricular complexes.  Recommended close follow-up with her PCP for recheck, consideration for cardiology referral.  Do not suspect ACS, has low heart score.  Will defer ER visit for now.    [1]  Allergies Allergen Reactions   Amlodipine Besylate     REACTION: headache   Codeine     Other reaction(s): Vertigo   Codeine Sulfate Other (See Comments)    intolerance   Lisinopril Swelling   Lisinopril-Hydrochlorothiazide      REACTION: swelling lips/angioedema   Meperidine Hcl Other (See Comments)    Passed out    Vicodin [Hydrocodone -Acetaminophen ]     GI upset   Wound Dressing Adhesive Other (See Comments)   Tape Rash    Patient reports unsure if from the adhesive or latex     Christopher Savannah, PA-C 02/02/24 1649  "

## 2024-02-02 NOTE — Discharge Instructions (Addendum)
 Please head straight to our partner urgent care at [facility] to have a chest x-ray done. Will update you later today with your x-ray report.  Please follow-up with your PCP as well to discuss whether or not you need a referral to a heart doctor for more testing.

## 2024-07-07 ENCOUNTER — Ambulatory Visit: Admitting: Neurology
